# Patient Record
Sex: Female | Born: 1965 | Race: White | Hispanic: No | Marital: Single | Smoking: Current some day smoker
Health system: Southern US, Community
[De-identification: ages and names within clinical notes are randomized; demographics above are authoritative.]

## PROBLEM LIST (undated history)

## (undated) DIAGNOSIS — R51 Headache: Secondary | ICD-10-CM

## (undated) DIAGNOSIS — F329 Major depressive disorder, single episode, unspecified: Secondary | ICD-10-CM

## (undated) DIAGNOSIS — IMO0002 Reserved for concepts with insufficient information to code with codable children: Secondary | ICD-10-CM

## (undated) DIAGNOSIS — C55 Malignant neoplasm of uterus, part unspecified: Secondary | ICD-10-CM

## (undated) DIAGNOSIS — F1910 Other psychoactive substance abuse, uncomplicated: Secondary | ICD-10-CM

## (undated) DIAGNOSIS — F32A Depression, unspecified: Secondary | ICD-10-CM

## (undated) HISTORY — PX: BREAST LUMPECTOMY: SHX2

## (undated) HISTORY — PX: ABDOMINAL HYSTERECTOMY: SHX81

## (undated) HISTORY — PX: PARTIAL HYSTERECTOMY: SHX80

## (undated) HISTORY — PX: TUBAL LIGATION: SHX77

## (undated) HISTORY — PX: TONSILLECTOMY: SUR1361

---

## 2002-04-11 ENCOUNTER — Emergency Department (HOSPITAL_COMMUNITY): Admission: EM | Admit: 2002-04-11 | Discharge: 2002-04-11 | Payer: Self-pay

## 2002-10-01 ENCOUNTER — Emergency Department (HOSPITAL_COMMUNITY): Admission: EM | Admit: 2002-10-01 | Discharge: 2002-10-02 | Payer: Self-pay | Admitting: Emergency Medicine

## 2003-01-21 ENCOUNTER — Emergency Department (HOSPITAL_COMMUNITY): Admission: EM | Admit: 2003-01-21 | Discharge: 2003-01-21 | Payer: Self-pay | Admitting: Emergency Medicine

## 2003-05-28 ENCOUNTER — Emergency Department (HOSPITAL_COMMUNITY): Admission: EM | Admit: 2003-05-28 | Discharge: 2003-05-28 | Payer: Self-pay | Admitting: Emergency Medicine

## 2009-01-16 ENCOUNTER — Emergency Department (HOSPITAL_COMMUNITY): Admission: EM | Admit: 2009-01-16 | Discharge: 2009-01-16 | Payer: Self-pay | Admitting: Emergency Medicine

## 2009-04-19 ENCOUNTER — Emergency Department (HOSPITAL_COMMUNITY): Admission: EM | Admit: 2009-04-19 | Discharge: 2009-04-19 | Payer: Self-pay | Admitting: Emergency Medicine

## 2009-10-12 ENCOUNTER — Emergency Department (HOSPITAL_COMMUNITY): Admission: EM | Admit: 2009-10-12 | Discharge: 2009-10-12 | Payer: Self-pay | Admitting: Emergency Medicine

## 2009-12-28 ENCOUNTER — Emergency Department (HOSPITAL_COMMUNITY): Admission: EM | Admit: 2009-12-28 | Discharge: 2009-12-28 | Payer: Self-pay | Admitting: Emergency Medicine

## 2010-04-06 ENCOUNTER — Emergency Department (HOSPITAL_COMMUNITY): Admission: EM | Admit: 2010-04-06 | Discharge: 2010-04-07 | Payer: Self-pay | Admitting: Emergency Medicine

## 2010-08-28 ENCOUNTER — Emergency Department (HOSPITAL_COMMUNITY): Admission: EM | Admit: 2010-08-28 | Discharge: 2010-08-28 | Payer: Self-pay | Admitting: Internal Medicine

## 2010-10-14 ENCOUNTER — Emergency Department (HOSPITAL_COMMUNITY): Admission: EM | Admit: 2010-10-14 | Discharge: 2010-10-14 | Payer: Self-pay | Admitting: Emergency Medicine

## 2010-11-03 ENCOUNTER — Emergency Department (HOSPITAL_COMMUNITY): Admission: EM | Admit: 2010-11-03 | Discharge: 2010-11-03 | Payer: Self-pay | Admitting: Internal Medicine

## 2010-11-18 ENCOUNTER — Emergency Department (HOSPITAL_COMMUNITY): Admission: EM | Admit: 2010-11-18 | Discharge: 2010-10-07 | Payer: Self-pay | Admitting: Emergency Medicine

## 2010-11-22 ENCOUNTER — Emergency Department (HOSPITAL_COMMUNITY)
Admission: EM | Admit: 2010-11-22 | Discharge: 2010-11-22 | Payer: Self-pay | Source: Home / Self Care | Admitting: Emergency Medicine

## 2010-12-07 ENCOUNTER — Emergency Department (HOSPITAL_BASED_OUTPATIENT_CLINIC_OR_DEPARTMENT_OTHER)
Admission: EM | Admit: 2010-12-07 | Discharge: 2010-12-07 | Payer: Self-pay | Source: Home / Self Care | Admitting: Emergency Medicine

## 2010-12-25 ENCOUNTER — Emergency Department (HOSPITAL_COMMUNITY)
Admission: EM | Admit: 2010-12-25 | Discharge: 2010-12-25 | Payer: Self-pay | Source: Home / Self Care | Admitting: Emergency Medicine

## 2011-01-13 ENCOUNTER — Emergency Department (HOSPITAL_COMMUNITY)
Admission: EM | Admit: 2011-01-13 | Discharge: 2011-01-13 | Disposition: A | Discharge status: Home / Self Care | Payer: Self-pay | Attending: Emergency Medicine | Admitting: Emergency Medicine

## 2011-01-13 ENCOUNTER — Emergency Department (HOSPITAL_COMMUNITY): Payer: Self-pay

## 2011-01-13 DIAGNOSIS — X500XXA Overexertion from strenuous movement or load, initial encounter: Secondary | ICD-10-CM | POA: Insufficient documentation

## 2011-01-13 DIAGNOSIS — S93409A Sprain of unspecified ligament of unspecified ankle, initial encounter: Secondary | ICD-10-CM | POA: Insufficient documentation

## 2011-01-13 DIAGNOSIS — G8929 Other chronic pain: Secondary | ICD-10-CM | POA: Insufficient documentation

## 2011-01-13 DIAGNOSIS — F17200 Nicotine dependence, unspecified, uncomplicated: Secondary | ICD-10-CM | POA: Insufficient documentation

## 2011-01-13 DIAGNOSIS — Y92009 Unspecified place in unspecified non-institutional (private) residence as the place of occurrence of the external cause: Secondary | ICD-10-CM | POA: Insufficient documentation

## 2011-02-22 LAB — COMPREHENSIVE METABOLIC PANEL
ALT: 18 U/L (ref 0–35)
AST: 23 U/L (ref 0–37)
Alkaline Phosphatase: 55 U/L (ref 39–117)
CO2: 28 mEq/L (ref 19–32)
Chloride: 103 mEq/L (ref 96–112)
GFR calc Af Amer: >60 mL/min (ref >60)
GFR calc non Af Amer: >60 mL/min (ref >60)
Glucose, Bld: 88 mg/dL (ref 70–99)
Potassium: 4.4 mEq/L (ref 3.5–5.1)
Sodium: 139 mEq/L (ref 135–145)
Total Bilirubin: 1 mg/dL (ref 0.3–1.2)

## 2011-02-22 LAB — URINALYSIS, ROUTINE W REFLEX MICROSCOPIC
Glucose, UA: NEGATIVE mg/dL
Ketones, ur: NEGATIVE mg/dL
Protein, ur: NEGATIVE mg/dL
Urobilinogen, UA: 0.2 mg/dL (ref 0.0–1.0)

## 2011-02-22 LAB — DIFFERENTIAL
Basophils Absolute: 0.1 10*3/uL (ref 0.0–0.1)
Basophils Relative: 1 % (ref 0–1)
Eosinophils Absolute: 0.2 10*3/uL (ref 0.0–0.7)
Eosinophils Relative: 2 % (ref 0–5)
Lymphs Abs: 2.1 10*3/uL (ref 0.7–4.0)
Neutrophils Relative %: 63 % (ref 43–77)

## 2011-02-22 LAB — CBC
HCT: 38 % (ref 36.0–46.0)
Hemoglobin: 13.1 g/dL (ref 12.0–15.0)
RBC: 4.28 MIL/uL (ref 3.87–5.11)

## 2011-02-22 LAB — URINE MICROSCOPIC-ADD ON

## 2011-02-23 LAB — URINALYSIS, ROUTINE W REFLEX MICROSCOPIC
Glucose, UA: NEGATIVE mg/dL
Ketones, ur: NEGATIVE mg/dL
Protein, ur: NEGATIVE mg/dL

## 2011-02-23 LAB — URINE CULTURE
Colony Count: NO GROWTH
Culture  Setup Time: 201110270838
Culture: NO GROWTH

## 2011-02-23 LAB — BASIC METABOLIC PANEL
Chloride: 108 mEq/L (ref 96–112)
GFR calc Af Amer: >60 mL/min (ref >60)
Potassium: 3.4 mEq/L — ABNORMAL LOW (ref 3.5–5.1)

## 2011-02-23 LAB — URINE MICROSCOPIC-ADD ON

## 2011-03-01 LAB — CBC
MCV: 94.1 fL (ref 78.0–100.0)
Platelets: 293 10*3/uL (ref 150–400)
WBC: 9.9 10*3/uL (ref 4.0–10.5)

## 2011-03-01 LAB — RAPID URINE DRUG SCREEN, HOSP PERFORMED: Benzodiazepines: NOT DETECTED

## 2011-03-01 LAB — URINALYSIS, ROUTINE W REFLEX MICROSCOPIC
Glucose, UA: NEGATIVE mg/dL
Specific Gravity, Urine: 1.001 — ABNORMAL LOW (ref 1.005–1.030)
pH: 7 (ref 5.0–8.0)

## 2011-03-01 LAB — BRAIN NATRIURETIC PEPTIDE: Pro B Natriuretic peptide (BNP): <30 pg/mL (ref 0.0–100.0)

## 2011-03-01 LAB — URINE MICROSCOPIC-ADD ON

## 2011-03-01 LAB — ETHANOL: Alcohol, Ethyl (B): 35 mg/dL — ABNORMAL HIGH (ref 0–10)

## 2011-03-01 LAB — POCT PREGNANCY, URINE: Preg Test, Ur: NEGATIVE

## 2011-03-01 LAB — SALICYLATE LEVEL: Salicylate Lvl: <4 mg/dL (ref 2.8–20.0)

## 2011-03-06 ENCOUNTER — Emergency Department (HOSPITAL_COMMUNITY)
Admission: EM | Admit: 2011-03-06 | Discharge: 2011-03-06 | Disposition: A | Discharge status: Home / Self Care | Payer: Self-pay | Attending: Emergency Medicine | Admitting: Emergency Medicine

## 2011-03-06 DIAGNOSIS — M545 Low back pain, unspecified: Secondary | ICD-10-CM | POA: Insufficient documentation

## 2011-03-06 DIAGNOSIS — X500XXA Overexertion from strenuous movement or load, initial encounter: Secondary | ICD-10-CM | POA: Insufficient documentation

## 2011-03-06 DIAGNOSIS — M62838 Other muscle spasm: Secondary | ICD-10-CM | POA: Insufficient documentation

## 2011-03-06 DIAGNOSIS — S335XXA Sprain of ligaments of lumbar spine, initial encounter: Secondary | ICD-10-CM | POA: Insufficient documentation

## 2011-03-29 LAB — URINALYSIS, ROUTINE W REFLEX MICROSCOPIC
Bilirubin Urine: NEGATIVE
Nitrite: NEGATIVE
Specific Gravity, Urine: 1.001 — ABNORMAL LOW (ref 1.005–1.030)
Urobilinogen, UA: 0.2 mg/dL (ref 0.0–1.0)

## 2011-03-29 LAB — URINE CULTURE

## 2011-06-20 ENCOUNTER — Emergency Department (HOSPITAL_COMMUNITY)
Admission: EM | Admit: 2011-06-20 | Discharge: 2011-06-20 | Disposition: A | Discharge status: Home / Self Care | Payer: Self-pay | Attending: Emergency Medicine | Admitting: Emergency Medicine

## 2011-06-20 DIAGNOSIS — Z87442 Personal history of urinary calculi: Secondary | ICD-10-CM | POA: Insufficient documentation

## 2011-06-20 DIAGNOSIS — R109 Unspecified abdominal pain: Secondary | ICD-10-CM | POA: Insufficient documentation

## 2011-06-20 DIAGNOSIS — R300 Dysuria: Secondary | ICD-10-CM | POA: Insufficient documentation

## 2011-06-20 DIAGNOSIS — R112 Nausea with vomiting, unspecified: Secondary | ICD-10-CM | POA: Insufficient documentation

## 2011-06-20 DIAGNOSIS — N390 Urinary tract infection, site not specified: Secondary | ICD-10-CM | POA: Insufficient documentation

## 2011-06-20 LAB — URINE MICROSCOPIC-ADD ON

## 2011-06-20 LAB — URINALYSIS, ROUTINE W REFLEX MICROSCOPIC
Glucose, UA: NEGATIVE mg/dL
Specific Gravity, Urine: 1.023 (ref 1.005–1.030)
pH: 7 (ref 5.0–8.0)

## 2011-06-22 ENCOUNTER — Emergency Department (HOSPITAL_COMMUNITY)
Admission: EM | Admit: 2011-06-22 | Discharge: 2011-06-22 | Disposition: A | Discharge status: Home / Self Care | Payer: Self-pay | Attending: Emergency Medicine | Admitting: Emergency Medicine

## 2011-06-22 DIAGNOSIS — R109 Unspecified abdominal pain: Secondary | ICD-10-CM | POA: Insufficient documentation

## 2011-06-22 DIAGNOSIS — IMO0002 Reserved for concepts with insufficient information to code with codable children: Secondary | ICD-10-CM | POA: Insufficient documentation

## 2011-06-22 DIAGNOSIS — M545 Low back pain, unspecified: Secondary | ICD-10-CM | POA: Insufficient documentation

## 2011-06-22 DIAGNOSIS — R112 Nausea with vomiting, unspecified: Secondary | ICD-10-CM | POA: Insufficient documentation

## 2011-06-22 DIAGNOSIS — N390 Urinary tract infection, site not specified: Secondary | ICD-10-CM | POA: Insufficient documentation

## 2011-06-22 DIAGNOSIS — Z87442 Personal history of urinary calculi: Secondary | ICD-10-CM | POA: Insufficient documentation

## 2011-06-22 DIAGNOSIS — R300 Dysuria: Secondary | ICD-10-CM | POA: Insufficient documentation

## 2011-06-22 LAB — URINALYSIS, ROUTINE W REFLEX MICROSCOPIC
Ketones, ur: NEGATIVE mg/dL
Leukocytes, UA: NEGATIVE
Nitrite: NEGATIVE
Protein, ur: NEGATIVE mg/dL
Urobilinogen, UA: 0.2 mg/dL (ref 0.0–1.0)

## 2011-06-22 LAB — RAPID URINE DRUG SCREEN, HOSP PERFORMED
Amphetamines: NOT DETECTED
Barbiturates: NOT DETECTED
Tetrahydrocannabinol: NOT DETECTED

## 2011-06-23 LAB — URINE CULTURE

## 2011-08-15 ENCOUNTER — Emergency Department (HOSPITAL_COMMUNITY): Payer: Self-pay

## 2011-08-15 ENCOUNTER — Emergency Department (HOSPITAL_COMMUNITY)
Admission: EM | Admit: 2011-08-15 | Discharge: 2011-08-16 | Disposition: A | Discharge status: Home / Self Care | Payer: Self-pay | Attending: Emergency Medicine | Admitting: Emergency Medicine

## 2011-08-15 DIAGNOSIS — Z87442 Personal history of urinary calculi: Secondary | ICD-10-CM | POA: Insufficient documentation

## 2011-08-15 DIAGNOSIS — M79609 Pain in unspecified limb: Secondary | ICD-10-CM | POA: Insufficient documentation

## 2011-08-15 DIAGNOSIS — S5010XA Contusion of unspecified forearm, initial encounter: Secondary | ICD-10-CM | POA: Insufficient documentation

## 2011-08-15 DIAGNOSIS — IMO0002 Reserved for concepts with insufficient information to code with codable children: Secondary | ICD-10-CM | POA: Insufficient documentation

## 2011-08-15 DIAGNOSIS — M25539 Pain in unspecified wrist: Secondary | ICD-10-CM | POA: Insufficient documentation

## 2011-09-14 ENCOUNTER — Emergency Department (HOSPITAL_COMMUNITY)
Admission: EM | Admit: 2011-09-14 | Discharge: 2011-09-14 | Disposition: A | Discharge status: Home / Self Care | Payer: Self-pay | Attending: Emergency Medicine | Admitting: Emergency Medicine

## 2011-09-14 DIAGNOSIS — R05 Cough: Secondary | ICD-10-CM | POA: Insufficient documentation

## 2011-09-14 DIAGNOSIS — R059 Cough, unspecified: Secondary | ICD-10-CM | POA: Insufficient documentation

## 2011-09-14 DIAGNOSIS — F17200 Nicotine dependence, unspecified, uncomplicated: Secondary | ICD-10-CM | POA: Insufficient documentation

## 2011-09-14 DIAGNOSIS — Z8542 Personal history of malignant neoplasm of other parts of uterus: Secondary | ICD-10-CM | POA: Insufficient documentation

## 2011-09-14 DIAGNOSIS — M549 Dorsalgia, unspecified: Secondary | ICD-10-CM | POA: Insufficient documentation

## 2011-12-15 ENCOUNTER — Emergency Department (HOSPITAL_COMMUNITY)
Admission: EM | Admit: 2011-12-15 | Discharge: 2011-12-15 | Disposition: A | Discharge status: Home / Self Care | Payer: Self-pay | Attending: Emergency Medicine | Admitting: Emergency Medicine

## 2011-12-15 DIAGNOSIS — M549 Dorsalgia, unspecified: Secondary | ICD-10-CM

## 2011-12-15 DIAGNOSIS — M545 Low back pain, unspecified: Secondary | ICD-10-CM | POA: Insufficient documentation

## 2011-12-15 MED ORDER — CYCLOBENZAPRINE HCL 10 MG PO TABS: 10.0000 mg | ORAL_TABLET | Freq: Three times a day (TID) | ORAL | Status: AC | PRN

## 2011-12-15 MED ORDER — OXYCODONE-ACETAMINOPHEN 5-325 MG PO TABS: 1.0000 | ORAL_TABLET | ORAL | Status: AC | PRN

## 2011-12-15 MED ORDER — IBUPROFEN 600 MG PO TABS: 600.0000 mg | ORAL_TABLET | Freq: Four times a day (QID) | ORAL | Status: AC | PRN

## 2011-12-15 NOTE — ED Notes (Signed)
Patient reports that she developed lower back pain after moving dresser, hx of same. Ambulatory on arrival

## 2011-12-15 NOTE — ED Provider Notes (Signed)
History     CSN: [id number]  Arrival date & time 12/15/11  1109   First MD Initiated Contact with Patient 12/15/11 1146      Chief Complaint  Patient presents with  . Back Pain    recently moved and has been lifting heavy boxes    (Consider location/radiation/quality/duration/timing/severity/associated sxs/prior treatment) Patient is a 46 y.o. female presenting with back pain. The history is provided by the patient. No language interpreter was used.  Back Pain  This is a recurrent problem. The current episode started 3 to 5 hours ago. The problem occurs constantly. The problem has been gradually worsening. The pain is associated with lifting heavy objects. The quality of the pain is described as aching. The pain does not radiate. The pain is at a severity of 7/10. The pain is moderate. The symptoms are aggravated by bending and certain positions. Pertinent negatives include no fever, no abdominal swelling, no bowel incontinence, no perianal numbness, no bladder incontinence, no dysuria, no pelvic pain, no leg pain, no paresthesias, no paresis, no tingling and no weakness. She has tried NSAIDs for the symptoms. The treatment provided no relief.    No past medical history on file.  No past surgical history on file.  No family history on file.  History  Substance Use Topics  . Smoking status: Not on file  . Smokeless tobacco: Not on file  . Alcohol Use: Not on file    OB History    No data available      Review of Systems  Constitutional: Negative for fever.  Gastrointestinal: Negative for bowel incontinence.  Genitourinary: Negative for bladder incontinence, dysuria and pelvic pain.  Musculoskeletal: Positive for back pain.  Neurological: Negative for tingling, weakness and paresthesias.    Allergies  Review of patient's allergies indicates no known allergies.  Home Medications   Current Outpatient Rx  Name Route Sig Dispense Refill  . ACETAMINOPHEN 500 MG PO TABS  Oral Take 500 mg by mouth every 6 (six) hours as needed. pain     . IBUPROFEN 200 MG PO TABS Oral Take 200 mg by mouth every 6 (six) hours as needed. pain     . NAPROXEN SODIUM 220 MG PO TABS Oral Take 220 mg by mouth 2 (two) times daily with a meal.        BP 134/90  Pulse 94  Temp 98.4 F (36.9 C)  Resp 18  SpO2 100%  Physical Exam  Nursing note and vitals reviewed. Constitutional: She is oriented to person, place, and time. She appears well-developed and well-nourished. No distress.  Eyes: Pupils are equal, round, and reactive to light.  Neck: Normal range of motion. Neck supple.  Cardiovascular: Normal rate.   Pulmonary/Chest: Effort normal and breath sounds normal.  Musculoskeletal: She exhibits tenderness. She exhibits no edema.       L lower back pain after lifting today  Neurological: She is alert and oriented to person, place, and time.  Skin: Skin is warm and dry. No erythema.  Psychiatric: She has a normal mood and affect.    ED Course  Procedures (including critical care time)  Labs Reviewed - No data to display No results found.   No diagnosis found.    MDM  Chronic Lower back pain worse after lifting boxes today.  Will treat with ibuprofen, ice flexoril and percocet.  Will follow up with PCP if not better tomorrow.  NO fever, good reflexes, no bowel or bladder problems.  Medical screening examination/treatment/procedure(s) were performed by non-physician practitioner and as supervising physician I was immediately available for consultation/collaboration. Osvaldo Human, M.D.     Jethro Bastos, NP 12/15/11 1823  Carleene Cooper III, MD 12/18/11 [phone]

## 2012-01-07 ENCOUNTER — Emergency Department (HOSPITAL_COMMUNITY)
Admission: EM | Admit: 2012-01-07 | Discharge: 2012-01-09 | Disposition: A | Discharge status: Other Acute Inpatient Hospital | Payer: Self-pay | Attending: Emergency Medicine | Admitting: Emergency Medicine

## 2012-01-07 ENCOUNTER — Encounter (HOSPITAL_COMMUNITY): Payer: Self-pay | Admitting: *Deleted

## 2012-01-07 DIAGNOSIS — R109 Unspecified abdominal pain: Secondary | ICD-10-CM | POA: Insufficient documentation

## 2012-01-07 DIAGNOSIS — F1910 Other psychoactive substance abuse, uncomplicated: Secondary | ICD-10-CM | POA: Insufficient documentation

## 2012-01-07 DIAGNOSIS — S41109A Unspecified open wound of unspecified upper arm, initial encounter: Secondary | ICD-10-CM | POA: Insufficient documentation

## 2012-01-07 DIAGNOSIS — S41111A Laceration without foreign body of right upper arm, initial encounter: Secondary | ICD-10-CM

## 2012-01-07 DIAGNOSIS — X789XXA Intentional self-harm by unspecified sharp object, initial encounter: Secondary | ICD-10-CM | POA: Insufficient documentation

## 2012-01-07 DIAGNOSIS — F1010 Alcohol abuse, uncomplicated: Secondary | ICD-10-CM | POA: Insufficient documentation

## 2012-01-07 DIAGNOSIS — R112 Nausea with vomiting, unspecified: Secondary | ICD-10-CM | POA: Insufficient documentation

## 2012-01-07 DIAGNOSIS — F10929 Alcohol use, unspecified with intoxication, unspecified: Secondary | ICD-10-CM

## 2012-01-07 DIAGNOSIS — R10819 Abdominal tenderness, unspecified site: Secondary | ICD-10-CM | POA: Insufficient documentation

## 2012-01-07 HISTORY — DX: Other psychoactive substance abuse, uncomplicated: F19.10

## 2012-01-07 HISTORY — DX: Malignant neoplasm of uterus, part unspecified: C55

## 2012-01-07 HISTORY — DX: Reserved for concepts with insufficient information to code with codable children: IMO0002

## 2012-01-07 LAB — DIFFERENTIAL
Eosinophils Absolute: 0.4 10*3/uL (ref 0.0–0.7)
Eosinophils Relative: 6 % — ABNORMAL HIGH (ref 0–5)
Lymphocytes Relative: 43 % (ref 12–46)
Lymphs Abs: 3 10*3/uL (ref 0.7–4.0)
Monocytes Relative: 6 % (ref 3–12)

## 2012-01-07 LAB — CBC
Hemoglobin: 14.7 g/dL (ref 12.0–15.0)
MCH: 32.2 pg (ref 26.0–34.0)
MCV: 90.8 fL (ref 78.0–100.0)
RBC: 4.56 MIL/uL (ref 3.87–5.11)
WBC: 7 10*3/uL (ref 4.0–10.5)

## 2012-01-07 MED ORDER — METOCLOPRAMIDE HCL 5 MG/ML IJ SOLN
10.0000 mg | Freq: Once | INTRAMUSCULAR | Status: AC
  Administered 2012-01-08: 10 mg via INTRAMUSCULAR
  Filled 2012-01-07: qty 2

## 2012-01-07 MED ORDER — LORAZEPAM 1 MG PO TABS
1.0000 mg | ORAL_TABLET | Freq: Once | ORAL | Status: AC
  Administered 2012-01-07: 1 mg via ORAL
  Filled 2012-01-07: qty 1

## 2012-01-07 MED ORDER — ONDANSETRON 8 MG PO TBDP
8.0000 mg | ORAL_TABLET | Freq: Once | ORAL | Status: AC
  Administered 2012-01-07: 8 mg via ORAL
  Filled 2012-01-07: qty 1

## 2012-01-07 NOTE — ED Provider Notes (Signed)
History     CSN: [id number]  Arrival date & time 01/07/12  2127   First MD Initiated Contact with Patient 01/07/12 2210      Chief Complaint  Patient presents with  . Medical Clearance     HPI  History provided by the patient. Patient is a 46 year old female with past history of substance abuse who presents with complaints of opiate withdrawal symptoms as well as lacerations to her right wrist. Patient states that she became upset with one of her friends who is withholding drugs for her and she acted out by cutting her wrists with a razor. She states that does not attempt for suicide. Patient states she does not want to herself or kill herself. Patient denies any homicidal ideations. Patient states that she is feeling nauseated with episodes of heaving and abdominal discomfort. Patient also reports having constipation for the last 2 days. She has no history of prior suicidal attempts. Patient states she has been in AA for drug problems in the past and does have a sponsor but has not contacted them. Patient requests help with her symptoms and drug addiction. Patient reports using oxycodone and Phenergan orally. She denies IV use.    Past Medical History  Diagnosis Date  . Substance abuse   . Uterine cancer   . Degenerative disk disease     Past Surgical History  Procedure Date  . Partial hysterectomy     No family history on file.  History  Substance Use Topics  . Smoking status: Not on file  . Smokeless tobacco: Not on file  . Alcohol Use:     OB History    Grav Para Term Preterm Abortions TAB SAB Ect Mult Living                  Review of Systems  Constitutional: Negative for fever.  Gastrointestinal: Positive for nausea, vomiting, abdominal pain and constipation.  All other systems reviewed and are negative.    Allergies  Review of patient's allergies indicates no known allergies.  Home Medications   Current Outpatient Rx  Name Route Sig Dispense Refill    . ACETAMINOPHEN 500 MG PO TABS Oral Take 1,000 mg by mouth every 6 (six) hours as needed. pain    . FENTANYL 100 MCG/HR TD PT72 Oral Take 0.25 patches by mouth daily. Patient cuts up patches and chews a quarter of a patch daily.    . OXYCODONE HCL 30 MG PO TABS Oral Take 30 mg by mouth 3 (three) times daily.      BP 134/87  Pulse 107  Temp(Src) 98.2 F (36.8 C) (Oral)  Resp 18  SpO2 100%  Physical Exam  Nursing note and vitals reviewed. Constitutional: She is oriented to person, place, and time. She appears well-developed and well-nourished. No distress.  HENT:  Head: Normocephalic and atraumatic.  Mouth/Throat: Oropharynx is clear and moist.  Eyes: Conjunctivae and EOM are normal. Pupils are equal, round, and reactive to light.  Cardiovascular: Normal rate and regular rhythm.   Pulmonary/Chest: Effort normal and breath sounds normal. No respiratory distress. She has no wheezes. She has no rales.  Abdominal: Soft. Bowel sounds are normal. She exhibits no distension. There is tenderness. There is no rebound and no guarding.       Mild diffuse tenderness  Musculoskeletal: Normal range of motion. She exhibits no edema and no tenderness.  Neurological: She is alert and oriented to person, place, and time.  Skin: Skin is warm and  dry. No rash noted. She is not diaphoretic.       Multiple superficial linear lacerations over right forearm and wrist. No tendon or deeper structure involvement. Normal distal movements in hand with normal sensation and cap refill.  Psychiatric: She has a normal mood and affect. Her behavior is normal. She does not exhibit a depressed mood. She expresses no homicidal and no suicidal ideation. She expresses no suicidal plans.    ED Course  Procedures    Labs Reviewed  CBC  DIFFERENTIAL  COMPREHENSIVE METABOLIC PANEL  ACETAMINOPHEN LEVEL  URINE RAPID DRUG SCREEN (HOSP PERFORMED)  ETHANOL   Results for orders placed during the hospital encounter of  01/07/12  CBC      Component Value Range   WBC 7.0  4.0 - 10.5 (K/uL)   RBC 4.56  3.87 - 5.11 (MIL/uL)   Hemoglobin 14.7  12.0 - 15.0 (g/dL)   HCT 96.0  45.4 - 09.8 (%)   MCV 90.8  78.0 - 100.0 (fL)   MCH 32.2  26.0 - 34.0 (pg)   MCHC 35.5  30.0 - 36.0 (g/dL)   RDW 11.9  14.7 - 82.9 (%)   Platelets 308  150 - 400 (K/uL)  DIFFERENTIAL      Component Value Range   Neutrophils Relative 43  43 - 77 (%)   Neutro Abs 3.0  1.7 - 7.7 (K/uL)   Lymphocytes Relative 43  12 - 46 (%)   Lymphs Abs 3.0  0.7 - 4.0 (K/uL)   Monocytes Relative 6  3 - 12 (%)   Monocytes Absolute 0.4  0.1 - 1.0 (K/uL)   Eosinophils Relative 6 (*) 0 - 5 (%)   Eosinophils Absolute 0.4  0.0 - 0.7 (K/uL)   Basophils Relative 1  0 - 1 (%)   Basophils Absolute 0.1  0.0 - 0.1 (K/uL)  COMPREHENSIVE METABOLIC PANEL      Component Value Range   Sodium 142  135 - 145 (mEq/L)   Potassium 3.2 (*) 3.5 - 5.1 (mEq/L)   Chloride 105  96 - 112 (mEq/L)   CO2 25  19 - 32 (mEq/L)   Glucose, Bld 84  70 - 99 (mg/dL)   BUN 14  6 - 23 (mg/dL)   Creatinine, Ser 5.62  0.50 - 1.10 (mg/dL)   Calcium 8.9  8.4 - 13.0 (mg/dL)   Total Protein 7.8  6.0 - 8.3 (g/dL)   Albumin 4.1  3.5 - 5.2 (g/dL)   AST 25  0 - 37 (U/L)   ALT 10  0 - 35 (U/L)   Alkaline Phosphatase 59  39 - 117 (U/L)   Total Bilirubin 0.3  0.3 - 1.2 (mg/dL)   GFR calc non Af Amer >90  >90 (mL/min)   GFR calc Af Amer >90  >90 (mL/min)  ACETAMINOPHEN LEVEL      Component Value Range   Acetaminophen (Tylenol), Serum <15.0  10 - 30 (ug/mL)  URINE RAPID DRUG SCREEN (HOSP PERFORMED)      Component Value Range   Opiates NONE DETECTED  NONE DETECTED    Cocaine NONE DETECTED  NONE DETECTED    Benzodiazepines POSITIVE (*) NONE DETECTED    Amphetamines NONE DETECTED  NONE DETECTED    Tetrahydrocannabinol POSITIVE (*) NONE DETECTED    Barbiturates NONE DETECTED  NONE DETECTED   ETHANOL      Component Value Range   Alcohol, Ethyl (B) 204 (*) 0 - 11 (mg/dL)       1.  Alcohol  intoxication   2. Polysubstance abuse   3. Lacerations of multiple sites of right arm       MDM  10:00 PM patient seen and evaluated. Patient in no acute distress.  Patient has been resting comfortably over the night and sleeping.  I've spoken with [facility] act team. They will see patient and evaluate.  [facility] act team went to see patient but she did not wake up from sleep and they will reassess later.  Patient has continued to sleep through the night. Psych holding orders are in place. Telepsych consult ordered.      Angus Seller, [place] 01/08/12 [phone]
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
## 2012-01-07 NOTE — ED Notes (Signed)
ZOX:[id number]<AV> Expected date:01/07/12<BR> Expected time: 9:32 PM<BR> Means of arrival:Ambulance<BR> Comments:<BR> EMS [facility] - detox

## 2012-01-08 ENCOUNTER — Encounter (HOSPITAL_COMMUNITY): Payer: Self-pay | Admitting: *Deleted

## 2012-01-08 LAB — URINE MICROSCOPIC-ADD ON

## 2012-01-08 LAB — URINALYSIS, ROUTINE W REFLEX MICROSCOPIC
Ketones, ur: NEGATIVE mg/dL
Nitrite: NEGATIVE
Specific Gravity, Urine: 1.011 (ref 1.005–1.030)
Urobilinogen, UA: 0.2 mg/dL (ref 0.0–1.0)
pH: 7 (ref 5.0–8.0)

## 2012-01-08 LAB — ETHANOL: Alcohol, Ethyl (B): 204 mg/dL — ABNORMAL HIGH (ref 0–11)

## 2012-01-08 LAB — ACETAMINOPHEN LEVEL: Acetaminophen (Tylenol), Serum: <15 ug/mL (ref 10–30)

## 2012-01-08 LAB — HEPATIC FUNCTION PANEL
ALT: 8 U/L (ref 0–35)
AST: 23 U/L (ref 0–37)
Albumin: 3.7 g/dL (ref 3.5–5.2)
Total Bilirubin: 1.1 mg/dL (ref 0.3–1.2)

## 2012-01-08 LAB — COMPREHENSIVE METABOLIC PANEL
ALT: 10 U/L (ref 0–35)
Alkaline Phosphatase: 59 U/L (ref 39–117)
BUN: 14 mg/dL (ref 6–23)
CO2: 25 mEq/L (ref 19–32)
Calcium: 8.9 mg/dL (ref 8.4–10.5)
GFR calc Af Amer: >90 mL/min (ref >90)
GFR calc non Af Amer: >90 mL/min (ref >90)
Glucose, Bld: 84 mg/dL (ref 70–99)
Potassium: 3.2 mEq/L — ABNORMAL LOW (ref 3.5–5.1)
Sodium: 142 mEq/L (ref 135–145)
Total Protein: 7.8 g/dL (ref 6.0–8.3)

## 2012-01-08 LAB — CBC
HCT: 38.9 % (ref 36.0–46.0)
Platelets: 262 10*3/uL (ref 150–400)
RBC: 4.21 MIL/uL (ref 3.87–5.11)
RDW: 13.1 % (ref 11.5–15.5)
WBC: 7.3 10*3/uL (ref 4.0–10.5)

## 2012-01-08 LAB — RAPID URINE DRUG SCREEN, HOSP PERFORMED
Barbiturates: NOT DETECTED
Benzodiazepines: POSITIVE — AB

## 2012-01-08 LAB — POCT I-STAT, CHEM 8
BUN: 13 mg/dL (ref 6–23)
Creatinine, Ser: 0.7 mg/dL (ref 0.50–1.10)
Glucose, Bld: 96 mg/dL (ref 70–99)
Potassium: 3.6 mEq/L (ref 3.5–5.1)
Sodium: 136 mEq/L (ref 135–145)

## 2012-01-08 MED ORDER — LORAZEPAM 1 MG PO TABS: 1.0000 mg | ORAL_TABLET | Freq: Four times a day (QID) | ORAL | Status: DC | PRN

## 2012-01-08 MED ORDER — BACITRACIN ZINC 500 UNIT/GM EX OINT
TOPICAL_OINTMENT | CUTANEOUS | Status: AC
  Filled 2012-01-08: qty 1.8

## 2012-01-08 MED ORDER — ZOLPIDEM TARTRATE 5 MG PO TABS: 5.0000 mg | ORAL_TABLET | Freq: Every evening | ORAL | Status: DC | PRN

## 2012-01-08 MED ORDER — CLONIDINE HCL 0.1 MG PO TABS
0.1000 mg | ORAL_TABLET | Freq: Four times a day (QID) | ORAL | Status: DC
  Administered 2012-01-08 – 2012-01-09 (×4): 0.1 mg via ORAL
  Filled 2012-01-08 (×4): qty 1

## 2012-01-08 MED ORDER — METHOCARBAMOL 500 MG PO TABS
500.0000 mg | ORAL_TABLET | Freq: Three times a day (TID) | ORAL | Status: DC | PRN
  Administered 2012-01-09: 500 mg via ORAL
  Filled 2012-01-08: qty 1

## 2012-01-08 MED ORDER — LOPERAMIDE HCL 2 MG PO CAPS: 2.0000 mg | ORAL_CAPSULE | ORAL | Status: DC | PRN

## 2012-01-08 MED ORDER — NICOTINE 21 MG/24HR TD PT24
21.0000 mg | MEDICATED_PATCH | Freq: Once | TRANSDERMAL | Status: DC
  Administered 2012-01-08: 21 mg via TRANSDERMAL
  Filled 2012-01-08: qty 1

## 2012-01-08 MED ORDER — FLUOXETINE HCL 20 MG PO CAPS
20.0000 mg | ORAL_CAPSULE | Freq: Every day | ORAL | Status: DC
  Administered 2012-01-08 – 2012-01-09 (×2): 20 mg via ORAL
  Filled 2012-01-08 (×3): qty 1

## 2012-01-08 MED ORDER — FAMOTIDINE 20 MG PO TABS
20.0000 mg | ORAL_TABLET | Freq: Once | ORAL | Status: AC
  Administered 2012-01-08: 20 mg via ORAL
  Filled 2012-01-08: qty 1

## 2012-01-08 MED ORDER — LORAZEPAM 1 MG PO TABS: 0.0000 mg | ORAL_TABLET | Freq: Two times a day (BID) | ORAL | Status: DC

## 2012-01-08 MED ORDER — IBUPROFEN 600 MG PO TABS: 600.0000 mg | ORAL_TABLET | Freq: Three times a day (TID) | ORAL | Status: DC | PRN

## 2012-01-08 MED ORDER — CLONIDINE HCL 0.1 MG PO TABS: 0.1000 mg | ORAL_TABLET | ORAL | Status: DC

## 2012-01-08 MED ORDER — LORAZEPAM 1 MG PO TABS
2.0000 mg | ORAL_TABLET | Freq: Once | ORAL | Status: AC
  Administered 2012-01-08: 2 mg via ORAL
  Filled 2012-01-08: qty 2

## 2012-01-08 MED ORDER — ONDANSETRON HCL 4 MG PO TABS
4.0000 mg | ORAL_TABLET | Freq: Three times a day (TID) | ORAL | Status: DC | PRN
  Administered 2012-01-08: 4 mg via ORAL
  Filled 2012-01-08: qty 1

## 2012-01-08 MED ORDER — VITAMIN B-1 100 MG PO TABS
100.0000 mg | ORAL_TABLET | Freq: Every day | ORAL | Status: DC
  Administered 2012-01-08 – 2012-01-09 (×2): 100 mg via ORAL
  Filled 2012-01-08 (×2): qty 1

## 2012-01-08 MED ORDER — LORAZEPAM 2 MG/ML IJ SOLN: 1.0000 mg | Freq: Four times a day (QID) | INTRAMUSCULAR | Status: DC | PRN

## 2012-01-08 MED ORDER — LORAZEPAM 1 MG PO TABS
1.0000 mg | ORAL_TABLET | Freq: Three times a day (TID) | ORAL | Status: DC | PRN
  Administered 2012-01-08: 1 mg via ORAL
  Filled 2012-01-08: qty 1

## 2012-01-08 MED ORDER — HYDROXYZINE HCL 25 MG PO TABS: 25.0000 mg | ORAL_TABLET | Freq: Four times a day (QID) | ORAL | Status: DC | PRN

## 2012-01-08 MED ORDER — FAMOTIDINE 20 MG PO TABS
20.0000 mg | ORAL_TABLET | Freq: Once | ORAL | Status: DC
  Filled 2012-01-08: qty 1

## 2012-01-08 MED ORDER — TRAZODONE HCL 100 MG PO TABS: 100.0000 mg | ORAL_TABLET | Freq: Every evening | ORAL | Status: DC | PRN

## 2012-01-08 MED ORDER — LORAZEPAM 1 MG PO TABS
0.0000 mg | ORAL_TABLET | Freq: Four times a day (QID) | ORAL | Status: DC
  Administered 2012-01-08: 1 mg via ORAL
  Filled 2012-01-08: qty 1

## 2012-01-08 MED ORDER — ONDANSETRON 4 MG PO TBDP: 4.0000 mg | ORAL_TABLET | Freq: Four times a day (QID) | ORAL | Status: DC | PRN

## 2012-01-08 MED ORDER — FOLIC ACID 1 MG PO TABS
1.0000 mg | ORAL_TABLET | Freq: Every day | ORAL | Status: DC
  Administered 2012-01-08 – 2012-01-09 (×2): 1 mg via ORAL
  Filled 2012-01-08 (×2): qty 1

## 2012-01-08 MED ORDER — GI COCKTAIL ~~LOC~~
30.0000 mL | Freq: Once | ORAL | Status: AC
  Administered 2012-01-08: 30 mL via ORAL
  Filled 2012-01-08: qty 30

## 2012-01-08 MED ORDER — LORAZEPAM 1 MG PO TABS
2.0000 mg | ORAL_TABLET | Freq: Three times a day (TID) | ORAL | Status: DC
  Administered 2012-01-08 (×2): 1 mg via ORAL
  Administered 2012-01-09: 2 mg via ORAL
  Filled 2012-01-08 (×2): qty 2

## 2012-01-08 MED ORDER — DIPHENHYDRAMINE HCL 50 MG/ML IJ SOLN
50.0000 mg | Freq: Once | INTRAMUSCULAR | Status: DC
  Filled 2012-01-08: qty 1

## 2012-01-08 MED ORDER — ALUM & MAG HYDROXIDE-SIMETH 200-200-20 MG/5ML PO SUSP: 30.0000 mL | ORAL | Status: DC | PRN

## 2012-01-08 MED ORDER — ADULT MULTIVITAMIN W/MINERALS CH
1.0000 | ORAL_TABLET | Freq: Every day | ORAL | Status: DC
  Administered 2012-01-08 – 2012-01-09 (×2): 1 via ORAL
  Filled 2012-01-08 (×2): qty 1

## 2012-01-08 MED ORDER — CLONIDINE HCL 0.1 MG PO TABS: 0.1000 mg | ORAL_TABLET | Freq: Every day | ORAL | Status: DC

## 2012-01-08 MED ORDER — THIAMINE HCL 100 MG/ML IJ SOLN: 100.0000 mg | Freq: Every day | INTRAMUSCULAR | Status: DC

## 2012-01-08 MED ORDER — DICYCLOMINE HCL 20 MG PO TABS: 20.0000 mg | ORAL_TABLET | ORAL | Status: DC | PRN

## 2012-01-08 MED ORDER — GI COCKTAIL ~~LOC~~
30.0000 mL | Freq: Once | ORAL | Status: DC
  Filled 2012-01-08: qty 30

## 2012-01-08 MED ORDER — NAPROXEN 500 MG PO TABS
500.0000 mg | ORAL_TABLET | Freq: Two times a day (BID) | ORAL | Status: DC | PRN
  Administered 2012-01-09: 500 mg via ORAL
  Filled 2012-01-08: qty 1

## 2012-01-08 NOTE — [facility] Counselor (Signed)
Pt declined at [facility] by Adonis Brook and [facility] by Steward Drone due to suicidal behaviors and cuts on wrists.  These programs do not work with SI pts.  [facility] has pt information and is reviewing for placement.  Pt initially did not want to go to [facility] but is willing to go there now. Initially she wanted to go to [facility] but since that is not an option she is open to tx at [facility].

## 2012-01-08 NOTE — ED Notes (Signed)
eatting lunch tray w/o difficulty

## 2012-01-08 NOTE — ED Notes (Signed)
Up to the bathroom 

## 2012-01-08 NOTE — ED Notes (Signed)
ACT Team in to see pt.

## 2012-01-08 NOTE — ED Notes (Signed)
Pt c/o increased lower abd pain.  Pt reports pain is sharp/crampy.  Pt reports that she has this type of pain before when she was drinking, but has never had it checked out.  No vomiting since origional episode, pt has been able to tolerate soup and crackers.  No diarrhea reported since earlier.

## 2012-01-08 NOTE — ED Notes (Signed)
Pt reports she vomited blood, dr Patria Mane aware and will see

## 2012-01-08 NOTE — ED Notes (Signed)
abd pain primarily lower abd.  ABD soft, tender to palpate, pos bowel sounds x4 quads

## 2012-01-08 NOTE — ED Notes (Signed)
Pt had taken her dressing off of her wrist-no bleeding noted

## 2012-01-08 NOTE — ED Notes (Signed)
Chicken noodle soup/crackers given

## 2012-01-08 NOTE — ED Notes (Signed)
telepsych in progress 

## 2012-01-08 NOTE — ED Notes (Signed)
Dr hunt aware

## 2012-01-08 NOTE — ED Notes (Signed)
Calmer, sitting on the site of the bed eatting, pain has decreased now 5/10.  Pt  Reports that she has been having this pain "for a while.

## 2012-01-08 NOTE — Progress Notes (Signed)
This clinician attempted to assess but pt too drowsy. Per Lorita Officer, pt recently given Benadryl. This clinician will attempt to assess later this am.

## 2012-01-08 NOTE — ED Notes (Signed)
Up to the desk on the phone 

## 2012-01-08 NOTE — ED Notes (Signed)
[facility] contacted concerning consult results

## 2012-01-08 NOTE — ED Notes (Signed)
Pt up to the bathroom and reports that she had blood in her stool (not observer) and blood in her urine-pt denies s/s of UTI,  And still reports lower abd pain

## 2012-01-08 NOTE — ED Notes (Signed)
Rt wrist cleaned w/ NS, non-stick dressing applied

## 2012-01-08 NOTE — ED Notes (Signed)
Sleeping, resp even and unlabored

## 2012-01-08 NOTE — ED Notes (Signed)
Attempted to call report to psych ED. Nurse not ready for report at this time and states she will call back. Will reassess.

## 2012-01-08 NOTE — ED Notes (Signed)
Up tot he bathroom to collect urine specimen
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
## 2012-01-08 NOTE — ED Notes (Signed)
telepsych compete

## 2012-01-08 NOTE — ED Notes (Signed)
ACT team at bedside, pt too somnolent to comply, ACT team will return for eval.

## 2012-01-09 ENCOUNTER — Inpatient Hospital Stay (HOSPITAL_COMMUNITY)
Admission: AD | Admit: 2012-01-09 | Discharge: 2012-01-16 | DRG: 897 | Disposition: A | Discharge status: Home / Self Care | Payer: PRIVATE HEALTH INSURANCE | Source: Ambulatory Visit | Attending: Psychiatry | Admitting: Psychiatry

## 2012-01-09 ENCOUNTER — Encounter (HOSPITAL_COMMUNITY): Payer: Self-pay

## 2012-01-09 DIAGNOSIS — F4321 Adjustment disorder with depressed mood: Secondary | ICD-10-CM | POA: Diagnosis present

## 2012-01-09 DIAGNOSIS — F1210 Cannabis abuse, uncomplicated: Secondary | ICD-10-CM | POA: Diagnosis present

## 2012-01-09 DIAGNOSIS — F1910 Other psychoactive substance abuse, uncomplicated: Secondary | ICD-10-CM

## 2012-01-09 DIAGNOSIS — F1020 Alcohol dependence, uncomplicated: Secondary | ICD-10-CM | POA: Diagnosis present

## 2012-01-09 DIAGNOSIS — R319 Hematuria, unspecified: Secondary | ICD-10-CM | POA: Clinically undetermined

## 2012-01-09 DIAGNOSIS — F19939 Other psychoactive substance use, unspecified with withdrawal, unspecified: Secondary | ICD-10-CM | POA: Diagnosis present

## 2012-01-09 DIAGNOSIS — Z79899 Other long term (current) drug therapy: Secondary | ICD-10-CM

## 2012-01-09 DIAGNOSIS — Z8544 Personal history of malignant neoplasm of other female genital organs: Secondary | ICD-10-CM

## 2012-01-09 DIAGNOSIS — IMO0002 Reserved for concepts with insufficient information to code with codable children: Secondary | ICD-10-CM | POA: Diagnosis present

## 2012-01-09 DIAGNOSIS — B9689 Other specified bacterial agents as the cause of diseases classified elsewhere: Secondary | ICD-10-CM

## 2012-01-09 DIAGNOSIS — R109 Unspecified abdominal pain: Secondary | ICD-10-CM | POA: Diagnosis present

## 2012-01-09 DIAGNOSIS — F1120 Opioid dependence, uncomplicated: Secondary | ICD-10-CM | POA: Diagnosis present

## 2012-01-09 DIAGNOSIS — F4310 Post-traumatic stress disorder, unspecified: Secondary | ICD-10-CM | POA: Diagnosis present

## 2012-01-09 DIAGNOSIS — R112 Nausea with vomiting, unspecified: Secondary | ICD-10-CM

## 2012-01-09 LAB — CBC
Hemoglobin: 13 g/dL (ref 12.0–15.0)
MCH: 32.2 pg (ref 26.0–34.0)
MCV: 92.3 fL (ref 78.0–100.0)
RBC: 4.04 MIL/uL (ref 3.87–5.11)

## 2012-01-09 LAB — COMPREHENSIVE METABOLIC PANEL
ALT: 8 U/L (ref 0–35)
BUN: 14 mg/dL (ref 6–23)
CO2: 25 mEq/L (ref 19–32)
Calcium: 9.2 mg/dL (ref 8.4–10.5)
Creatinine, Ser: 0.7 mg/dL (ref 0.50–1.10)
GFR calc Af Amer: >90 mL/min (ref >90)
GFR calc non Af Amer: >90 mL/min (ref >90)
Glucose, Bld: 105 mg/dL — ABNORMAL HIGH (ref 70–99)

## 2012-01-09 LAB — LIPASE, BLOOD: Lipase: 52 U/L (ref 11–59)

## 2012-01-09 MED ORDER — CLONIDINE HCL 0.1 MG PO TABS
0.1000 mg | ORAL_TABLET | Freq: Four times a day (QID) | ORAL | Status: DC
  Administered 2012-01-09 – 2012-01-10 (×4): 0.1 mg via ORAL
  Filled 2012-01-09 (×4): qty 1

## 2012-01-09 MED ORDER — CHLORDIAZEPOXIDE HCL 25 MG PO CAPS
25.0000 mg | ORAL_CAPSULE | Freq: Four times a day (QID) | ORAL | Status: DC | PRN
  Administered 2012-01-11 – 2012-01-12 (×5): 25 mg via ORAL
  Filled 2012-01-09 (×6): qty 1

## 2012-01-09 MED ORDER — TRAZODONE HCL 50 MG PO TABS
50.0000 mg | ORAL_TABLET | Freq: Every evening | ORAL | Status: DC | PRN
  Administered 2012-01-09 – 2012-01-15 (×7): 50 mg via ORAL
  Filled 2012-01-09 (×5): qty 1
  Filled 2012-01-09: qty 7
  Filled 2012-01-09 (×2): qty 1

## 2012-01-09 MED ORDER — LOPERAMIDE HCL 2 MG PO CAPS
2.0000 mg | ORAL_CAPSULE | ORAL | Status: AC | PRN
  Administered 2012-01-10: 4 mg via ORAL

## 2012-01-09 MED ORDER — MAGNESIUM HYDROXIDE 400 MG/5ML PO SUSP
30.0000 mL | Freq: Every day | ORAL | Status: DC | PRN
  Administered 2012-01-15: 30 mL via ORAL

## 2012-01-09 MED ORDER — ALUM & MAG HYDROXIDE-SIMETH 200-200-20 MG/5ML PO SUSP
30.0000 mL | ORAL | Status: DC | PRN
  Administered 2012-01-10: 30 mL via ORAL

## 2012-01-09 MED ORDER — CLONIDINE HCL 0.1 MG PO TABS: 0.1000 mg | ORAL_TABLET | Freq: Every day | ORAL | Status: DC

## 2012-01-09 MED ORDER — METHOCARBAMOL 500 MG PO TABS
500.0000 mg | ORAL_TABLET | Freq: Three times a day (TID) | ORAL | Status: AC | PRN
  Administered 2012-01-10 – 2012-01-14 (×8): 500 mg via ORAL
  Filled 2012-01-09 (×8): qty 1

## 2012-01-09 MED ORDER — HYDROXYZINE HCL 25 MG PO TABS
25.0000 mg | ORAL_TABLET | Freq: Four times a day (QID) | ORAL | Status: DC | PRN
  Administered 2012-01-12: 25 mg via ORAL
  Filled 2012-01-09: qty 1

## 2012-01-09 MED ORDER — CLONIDINE HCL 0.1 MG PO TABS: 0.1000 mg | ORAL_TABLET | ORAL | Status: DC

## 2012-01-09 MED ORDER — NAPROXEN 500 MG PO TABS
500.0000 mg | ORAL_TABLET | Freq: Two times a day (BID) | ORAL | Status: AC | PRN
  Administered 2012-01-09 – 2012-01-13 (×4): 500 mg via ORAL
  Filled 2012-01-09 (×4): qty 1

## 2012-01-09 MED ORDER — DICYCLOMINE HCL 20 MG PO TABS
20.0000 mg | ORAL_TABLET | ORAL | Status: AC | PRN
  Administered 2012-01-09 – 2012-01-12 (×8): 20 mg via ORAL
  Filled 2012-01-09 (×8): qty 1

## 2012-01-09 MED ORDER — INFLUENZA VIRUS VACC SPLIT PF IM SUSP
0.5000 mL | INTRAMUSCULAR | Status: AC
  Administered 2012-01-10: 0.5 mL via INTRAMUSCULAR

## 2012-01-09 MED ORDER — ACETAMINOPHEN 325 MG PO TABS
650.0000 mg | ORAL_TABLET | Freq: Four times a day (QID) | ORAL | Status: DC | PRN
  Administered 2012-01-15 – 2012-01-16 (×2): 650 mg via ORAL

## 2012-01-09 MED ORDER — ONDANSETRON 4 MG PO TBDP
4.0000 mg | ORAL_TABLET | Freq: Four times a day (QID) | ORAL | Status: AC | PRN
  Administered 2012-01-10 – 2012-01-11 (×4): 4 mg via ORAL
  Filled 2012-01-09 (×2): qty 1

## 2012-01-09 NOTE — [facility] Counselor (Signed)
Accepted to [facility] by Lynann Bologna, NP to Dr. Lupe Carney. Patient is pending a 300 hall bed assignment at this time.
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
## 2012-01-09 NOTE — Progress Notes (Signed)
Voluntary admission for 46 y.o. Female with flat affect and anxious mood.  Pt. Reports drinking 1/2 gallon on vodka for the last 6 month and using heroin, non prescribed opiates  and chewing Fentanyl patches.  Reports her son died  in 75 and that she has never dealt with his death or anything else since then.  Pt. Has superficial laceration to her right wrist.  No signs or symptoms of infection noted.  Medical History of Uterine Cancer, DDD.  Pt. Tearful and reports that she has lost everything.  Pt.  Reported diarrhea today and urine pink in color.  Denies SI /HI and denies A/V hallucinations and contracts for safety.  Reports weight loss of 20 lbs.  Food and fluids given.

## 2012-01-09 NOTE — ED Notes (Signed)
Pt sleeping CIWA = 0 

## 2012-01-09 NOTE — ED Provider Notes (Signed)
Medical screening examination/treatment/procedure(s) were conducted as a shared visit with non-physician practitioner(s) and myself.  I personally evaluated the patient during the encounter  Loren Racer, MD 01/09/12 [phone]

## 2012-01-10 DIAGNOSIS — Z23 Encounter for immunization: Secondary | ICD-10-CM

## 2012-01-10 DIAGNOSIS — N760 Acute vaginitis: Secondary | ICD-10-CM

## 2012-01-10 DIAGNOSIS — A499 Bacterial infection, unspecified: Secondary | ICD-10-CM

## 2012-01-10 MED ORDER — CHLORDIAZEPOXIDE HCL 25 MG PO CAPS
25.0000 mg | ORAL_CAPSULE | Freq: Every day | ORAL | Status: AC
  Administered 2012-01-13: 25 mg via ORAL
  Filled 2012-01-10: qty 1

## 2012-01-10 MED ORDER — NICOTINE POLACRILEX 2 MG MT GUM
2.0000 mg | CHEWING_GUM | OROMUCOSAL | Status: DC | PRN
  Administered 2012-01-10: 2 mg via ORAL

## 2012-01-10 MED ORDER — CITALOPRAM HYDROBROMIDE 20 MG PO TABS
20.0000 mg | ORAL_TABLET | Freq: Every day | ORAL | Status: DC
  Administered 2012-01-10 – 2012-01-12 (×3): 20 mg via ORAL
  Filled 2012-01-10 (×4): qty 1

## 2012-01-10 MED ORDER — NICOTINE 21 MG/24HR TD PT24
21.0000 mg | MEDICATED_PATCH | Freq: Every day | TRANSDERMAL | Status: DC
  Administered 2012-01-10 – 2012-01-16 (×7): 21 mg via TRANSDERMAL
  Filled 2012-01-10 (×2): qty 1
  Filled 2012-01-10 (×2): qty 7
  Filled 2012-01-10 (×6): qty 1

## 2012-01-10 MED ORDER — THIAMINE HCL 100 MG/ML IJ SOLN
100.0000 mg | Freq: Once | INTRAMUSCULAR | Status: AC
  Administered 2012-01-10: 100 mg via INTRAMUSCULAR

## 2012-01-10 MED ORDER — CHLORDIAZEPOXIDE HCL 25 MG PO CAPS
25.0000 mg | ORAL_CAPSULE | Freq: Four times a day (QID) | ORAL | Status: AC
  Administered 2012-01-10 (×3): 25 mg via ORAL
  Filled 2012-01-10 (×3): qty 1

## 2012-01-10 MED ORDER — FAMOTIDINE 20 MG PO TABS
20.0000 mg | ORAL_TABLET | Freq: Every day | ORAL | Status: DC
  Administered 2012-01-10 – 2012-01-16 (×7): 20 mg via ORAL
  Filled 2012-01-10: qty 1
  Filled 2012-01-10: qty 7
  Filled 2012-01-10 (×7): qty 1

## 2012-01-10 MED ORDER — CHLORDIAZEPOXIDE HCL 25 MG PO CAPS
25.0000 mg | ORAL_CAPSULE | ORAL | Status: AC
  Administered 2012-01-12 (×2): 25 mg via ORAL
  Filled 2012-01-10: qty 1

## 2012-01-10 MED ORDER — CHLORDIAZEPOXIDE HCL 25 MG PO CAPS
25.0000 mg | ORAL_CAPSULE | Freq: Three times a day (TID) | ORAL | Status: AC
  Administered 2012-01-11 (×2): 25 mg via ORAL
  Filled 2012-01-10 (×3): qty 1

## 2012-01-10 MED ORDER — VITAMIN B-1 100 MG PO TABS
100.0000 mg | ORAL_TABLET | Freq: Every day | ORAL | Status: DC
  Administered 2012-01-11 – 2012-01-16 (×6): 100 mg via ORAL
  Filled 2012-01-10 (×8): qty 1

## 2012-01-10 MED ORDER — ADULT MULTIVITAMIN W/MINERALS CH
1.0000 | ORAL_TABLET | Freq: Every day | ORAL | Status: DC
  Administered 2012-01-10 – 2012-01-16 (×7): 1 via ORAL
  Filled 2012-01-10 (×7): qty 1

## 2012-01-10 NOTE — Tx Team (Signed)
Initial Interdisciplinary Treatment Plan  PATIENT STRENGTHS: (choose at least two) Average or above average intelligence Communication skills General fund of knowledge  PATIENT STRESSORS: Substance abuse   PROBLEM LIST: Problem List/Patient Goals Date to be addressed Date deferred Reason deferred Estimated date of resolution  Polysubstance abuse 01-09-12     Suicidal attempt 01-09-12                                                DISCHARGE CRITERIA:  Adequate post-discharge living arrangements Improved stabilization in mood, thinking, and/or behavior Motivation to continue treatment in a less acute level of care Verbal commitment to aftercare and medication compliance  PRELIMINARY DISCHARGE PLAN: Attend aftercare/continuing care group Attend PHP/IOP Attend 12-step recovery group Participate in family therapy  PATIENT/FAMIILY INVOLVEMENT: This treatment plan has been presented to and reviewed with the patient, TARAN HABLE, and/or family member.  The patient and family have been given the opportunity to ask questions and make suggestions.  Mickeal Needy 01/10/2012, 1:13 AM

## 2012-01-10 NOTE — Progress Notes (Signed)
Recreation Therapy Notes  01/10/2012         Time: 1000      Group Topic/Focus: Patient invited to participate in animal assisted therapy. Pets as a coping skill and responsibility were discussed.   Participation Level: Active  Participation Quality: Appropriate and Attentive  Affect: Appropriate  Cognitive: Appropriate and Oriented   Additional Comments: None   Mallory Shaw 01/10/2012 11:35 AM

## 2012-01-10 NOTE — Progress Notes (Signed)
Recreation Therapy Notes  01/10/2012         Time: 1415      Group Topic/Focus: The focus of the group is on enhancing the patients' ability to cope with stressors by understanding what coping is, why it is important, the negative effects of stress and developing healthier coping skills.  Participation Level: Minimal  Participation Quality: Attentive  Affect: Appropriate  Cognitive: Oriented   Additional Comments: Patient late to group as she was with her nurse, reported feeling dizzy but participated to the best of her abilities.   Aerial Dilley 01/10/2012 3:44 PM
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
## 2012-01-10 NOTE — Progress Notes (Signed)
Patient ID: Mallory Shaw, female   DOB: Sep 25, 1966, 46 y.o.   MRN: [id number] Pt. Reports shaking and stomach cramps, writer reports s/s of detox. Meds administered see MAR. Cuts to left arm cleansed and redressed. Pt. Denies SHI. Staff will continue to monitor q64min for safety.

## 2012-01-10 NOTE — Progress Notes (Signed)
Pt is in her room in bed.  C/o weakness, N/V, and abd cramping.  Pt left emesis in the toilet which contained bright red blood.  MD are aware pt is having blood in her stool and emesis and labs are pending.  Pt given Maalox, Imodium, Bentyl, and Zofran for her symptoms at this time.  Will continue to monitor.  Pt encouraged to use her call bell if she needs to get up to prevent a fall.  Pt voices understanding.  Safety maintained with q15 minute checks.

## 2012-01-10 NOTE — Discharge Planning (Signed)
New patient attended AM group, good participation.  Tearful throughout presentation.  Recently lost job and vehicle due to use of alcohol, heroin and opiates.  At the suggestion of friend, called 911 for help.  Has had 6 years of sobriety in the past with the help of AA.  Grief and loss issues secondary to loss of son.  "I can't go on pretending everything is fine.  It's not and I need help."   Mother and a close family friend are her supports.  Wants rehab from here.  Feels lousy due to withdrawal symptoms.
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
## 2012-01-10 NOTE — Progress Notes (Signed)
Patient ID: Mallory Shaw, female   DOB: [birth year], 46 y.o.   MRN: [id number] Pt reports poor sleep and poor appetite.  Her energy level is low and she denies any thoughts of self harm.  This morning she reported vomiting after breakfast and had noticeable tremors.  Pt started on nicotine gum for smoking habit of a pack daily.  Pt given Quitline number as she is expressing interest in smoking cessation.
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
## 2012-01-11 ENCOUNTER — Encounter (HOSPITAL_COMMUNITY): Payer: Self-pay | Admitting: Emergency Medicine

## 2012-01-11 DIAGNOSIS — R319 Hematuria, unspecified: Secondary | ICD-10-CM | POA: Clinically undetermined

## 2012-01-11 DIAGNOSIS — F1120 Opioid dependence, uncomplicated: Principal | ICD-10-CM

## 2012-01-11 LAB — URINALYSIS, ROUTINE W REFLEX MICROSCOPIC
Bilirubin Urine: NEGATIVE
Glucose, UA: NEGATIVE mg/dL
Ketones, ur: NEGATIVE mg/dL
Leukocytes, UA: NEGATIVE
Nitrite: NEGATIVE
Protein, ur: NEGATIVE mg/dL
Specific Gravity, Urine: 1.013 (ref 1.005–1.030)
Urobilinogen, UA: 0.2 mg/dL (ref 0.0–1.0)
pH: 6.5 (ref 5.0–8.0)

## 2012-01-11 LAB — DIFFERENTIAL
Basophils Absolute: 0 10*3/uL (ref 0.0–0.1)
Eosinophils Relative: 3 % (ref 0–5)
Lymphocytes Relative: 29 % (ref 12–46)
Monocytes Absolute: 0.4 10*3/uL (ref 0.1–1.0)
Monocytes Relative: 6 % (ref 3–12)
Neutro Abs: 3.6 10*3/uL (ref 1.7–7.7)

## 2012-01-11 LAB — CBC
HCT: 35.3 % — ABNORMAL LOW (ref 36.0–46.0)
Hemoglobin: 11.9 g/dL — ABNORMAL LOW (ref 12.0–15.0)
MCHC: 33.7 g/dL (ref 30.0–36.0)
MCV: 92.9 fL (ref 78.0–100.0)
Platelets: 208 10*3/uL (ref 150–400)
RBC: 3.98 MIL/uL (ref 3.87–5.11)
RDW: 13 % (ref 11.5–15.5)
RDW: 12.9 % (ref 11.5–15.5)
WBC: 5.6 10*3/uL (ref 4.0–10.5)
WBC: 5.8 10*3/uL (ref 4.0–10.5)

## 2012-01-11 LAB — URINE MICROSCOPIC-ADD ON

## 2012-01-11 LAB — WET PREP, GENITAL
Trich, Wet Prep: NONE SEEN
Yeast Wet Prep HPF POC: NONE SEEN

## 2012-01-11 LAB — BASIC METABOLIC PANEL
CO2: 25 mEq/L (ref 19–32)
Calcium: 8.7 mg/dL (ref 8.4–10.5)
Chloride: 104 mEq/L (ref 96–112)
Creatinine, Ser: 0.62 mg/dL (ref 0.50–1.10)
Glucose, Bld: 91 mg/dL (ref 70–99)

## 2012-01-11 MED ORDER — METRONIDAZOLE 500 MG PO TABS
500.0000 mg | ORAL_TABLET | Freq: Two times a day (BID) | ORAL | Status: DC
  Administered 2012-01-11 – 2012-01-16 (×11): 500 mg via ORAL
  Filled 2012-01-11 (×15): qty 1

## 2012-01-11 MED ORDER — SODIUM CHLORIDE 0.9 % IV BOLUS (SEPSIS)
500.0000 mL | INTRAVENOUS | Status: AC
  Administered 2012-01-11: 500 mL via INTRAVENOUS

## 2012-01-11 MED ORDER — LORAZEPAM 2 MG/ML IJ SOLN
1.0000 mg | Freq: Once | INTRAMUSCULAR | Status: AC
  Administered 2012-01-11: 1 mg via INTRAVENOUS
  Filled 2012-01-11: qty 1

## 2012-01-11 MED ORDER — PANTOPRAZOLE SODIUM 40 MG IV SOLR
40.0000 mg | Freq: Once | INTRAVENOUS | Status: AC
  Administered 2012-01-11: 40 mg via INTRAVENOUS
  Filled 2012-01-11: qty 40

## 2012-01-11 MED ORDER — ACETAMINOPHEN 500 MG PO TABS
1000.0000 mg | ORAL_TABLET | Freq: Once | ORAL | Status: AC
  Administered 2012-01-11: 1000 mg via ORAL
  Filled 2012-01-11: qty 2

## 2012-01-11 NOTE — ED Notes (Signed)
Lisette, PA notified of pelvic set up

## 2012-01-11 NOTE — ED Notes (Signed)
Security called to escort pt to behavioral health.

## 2012-01-11 NOTE — Progress Notes (Signed)
[facility] Group Notes:  (Counselor/Nursing/MHT/Case Management/Adjunct)  01/11/2012 10:21 AM  Type of Therapy:  Group Therapy at 11:00am  Group Cancelled due to [employer], Mallory Shaw 01/11/2012, 10:21 AM  [facility] Group Notes:  (Counselor/Nursing/MHT/Case Management/Adjunct)  01/11/2012 3:39 PM  Type of Therapy:  Group Therapy at 1:15PM  Participation Level:  Minimal as she came in late after return from ED  Participation Quality:  Appropriate and Sharing  Affect:  Appropriate and relieved  Cognitive:  Appropriate  Insight:  Good  Engagement in Group:  Not Applicable  Engagement in Therapy:  Not  applicable  Modes of Intervention:  Clarification, Socialization and Support  Summary of Progress/Problems: Mallory Shaw came in towards end of group session as she had been at the ED; patient reports relief in knowing cause of abdominal pain and is hopeful for some relief soon. Patient shared at length about anxiety related to the unknown and other patients identified and were supportive of her. Patient also shared experience with nurse in ED: I am so amazed I actually asked for something nonnarcotic for the pain!" ~ unfortunately the nurse's response was "I would never give you any narcotics" to which the pt felt judged, yet she was able to  remain calm.  [name] 01/11/2012, 3:49 PM

## 2012-01-11 NOTE — Progress Notes (Signed)
Pt up and attended evening group tonight.  Pt mood is improved this evening.  She was sent to the ED for evaluation of her severe abdominal cramps and returned with a dx of bacterial infection.  She was started on Flagyl tonight.  She reports feeling better tonight and relieved that she now has a reason for her cramping.  She is still c/o withdrawal symptoms and prns are given as ordered.  She denies SI/HI.  Pleasant/cooperative.  Safety maintained with q15 minute checks.

## 2012-01-11 NOTE — Treatment Plan (Signed)
Interdisciplinary Treatment Plan Update (Adult)  Date: 01/11/2012  Time Reviewed: 9:51 AM   Progress in Treatment: Attending groups: Yes Participating in groups: Yes Taking medication as prescribed: Yes Tolerating medication: Yes   Family/Significant othe contact made: Counselor if suicide prevention is necessary   Patient understands diagnosis:  Yes  As evidenced by asking for help with substances Discussing patient identified problems/goals with staff:  Yes See below Medical problems stabilized or resolved:  No  Pain in stomach, blood in urine,  Will send to ED for checkout Denies suicidal/homicidal ideation: Yes  In tx team Issues/concerns per patient self-inventory:  Withdrawal symptoms,  Concerns about medical problems Other:  New problem(s) identified: N/A  Reason for Continuation of Hospitalization: Depression Medication stabilization Withdrawal symptoms  Interventions implemented related to continuation of hospitalization: Get medically checked out at ED,  Detox protocol,  Encourage group attendance and participation  Additional comments:  Estimated length of stay:2-4 days  Discharge Plan:Wants to get into rehab  New goal(s): N/A  Review of initial/current patient goals per problem list:   1.  Goal(s): Stabilize medically  Met:  No  Target date:1/31  As evidenced [id number] the help of the ED  2.  Goal (s):Safely detox from alcohol  Met:  No  Target date:2/1  As evidenced [id number] in CIWA score from current to 0  3.  Goal(s):Get into rehab from here  Met:  No  Target date:2/3  As evidenced [id number] a bed  4.  Goal(s):  Met:  No  Target date:  As evidenced by:  Attendees: Patient:  Mallory Shaw 01/11/2012 9:51 AM  Family:     Physician:   01/11/2012 9:51 AM   Nursing: Carolynn Comment   01/11/2012 9:51 AM   Case Manager:  Richelle Ito, LCSW 01/11/2012 9:51 AM   Counselor:  Ronda Fairly, LCSWA 01/11/2012 9:51 AM   Other: Shelda Jakes   01/11/2012 9:51 AM  Other:     Other:     Other:      Scribe for Treatment Team:   Ida Rogue, 01/11/2012 9:51 AM

## 2012-01-11 NOTE — ED Notes (Signed)
Pt given meal tray prior to transfer back to facility.

## 2012-01-11 NOTE — ED Notes (Signed)
ION:[id number]<MW> Expected date:01/11/12<BR> Expected time:11:37 AM<BR> Means of arrival:Ambulance<BR> Comments:<BR> Hold for [facility]

## 2012-01-11 NOTE — ED Provider Notes (Signed)
History     CSN: [id number]  Arrival date & time 01/11/12  1139   First MD Initiated Contact with Patient 01/11/12 1159      Chief Complaint  Patient presents with  . Abdominal Pain    for several weeks  . Emesis    pt reports seeing blood in vomit    (Consider location/radiation/quality/duration/timing/severity/associated sxs/prior treatment) HPI Comments: Patient presents emergency department with chief complaint of suprapubic abdominal pain, nausea, vomiting, diarrhea.  She was sent from the behavioral health hospitalist [facility].  Patient is currently receiving inpatient detox from alcohol and opiate dependency.  Patient has a history of partial hysterectomy for uterine cancer and substance abuse.  Patient states she is sexually active and does not use protection.  Patient denies pain and burning with urination, hematuria.  Patient states that she's been having abdominal cramping for the last 6 months and vomiting for the last 2 weeks.  Patient states that she is unable to hold down food at all and that yesterday she vomited 4 times.  She noticed hematemesis one time yesterday.  Patient denies any exacerbating or alleviating symptoms of her suprapubic abdominal pain.  Patient rates her pain as a 10 out of 10.  Patient is a 46 y.o. female presenting with abdominal pain and vomiting.  Abdominal Pain The primary symptoms of the illness include abdominal pain, nausea, vomiting and diarrhea. The primary symptoms of the illness do not include fever, shortness of breath or dysuria.  Symptoms associated with the illness do not include chills, constipation, urgency or hematuria.  Emesis  Associated symptoms include abdominal pain and diarrhea. Pertinent negatives include no arthralgias, no chills, no cough, no fever, no headaches and no myalgias.    Past Medical History  Diagnosis Date  . Substance abuse   . Uterine cancer   . Degenerative disk disease     Past Surgical History    Procedure Date  . Partial hysterectomy     History reviewed. No pertinent family history.  History  Substance Use Topics  . Smoking status: Current Everyday Smoker -- 1.0 packs/day for 20 years  . Smokeless tobacco: Not on file  . Alcohol Use: Yes    OB History    Grav Para Term Preterm Abortions TAB SAB Ect Mult Living                  Review of Systems  Constitutional: Positive for appetite change. Negative for fever and chills.  HENT: Negative for neck stiffness and dental problem.   Eyes: Negative for visual disturbance.  Respiratory: Negative for cough, chest tightness, shortness of breath and wheezing.   Cardiovascular: Negative for chest pain.  Gastrointestinal: Positive for nausea, vomiting, abdominal pain and diarrhea. Negative for constipation, blood in stool, abdominal distention, anal bleeding and rectal pain.  Genitourinary: Negative for dysuria, urgency, hematuria and flank pain.  Musculoskeletal: Negative for myalgias and arthralgias.  Skin: Negative for rash.  Neurological: Negative for dizziness, syncope, speech difficulty, numbness and headaches.  Hematological: Does not bruise/bleed easily.  All other systems reviewed and are negative.    Allergies  Review of patient's allergies indicates no known allergies.  Home Medications   Current Outpatient Rx  Name Route Sig Dispense Refill  . FENTANYL 100 MCG/HR TD PT72 Oral Take 0.25 patches by mouth daily. Patient cuts up patches and chews a quarter of a patch daily.    . OXYCODONE HCL 30 MG PO TABS Oral Take 30 mg by mouth 3 (  three) times daily.    . ACETAMINOPHEN 500 MG PO TABS Oral Take 1,000 mg by mouth every 6 (six) hours as needed. pain      BP 108/68  Pulse 57  Temp(Src) 97.8 F (36.6 C) (Oral)  Resp 18  Ht 4' 11.5" (1.511 m)  Wt 102 lb (46.267 kg)  BMI 20.26 kg/m2  SpO2 100%  Physical Exam  Nursing note and vitals reviewed. Constitutional: Vital signs are normal. She appears  well-developed and well-nourished. No distress.  HENT:  Head: Normocephalic and atraumatic.  Mouth/Throat: Uvula is midline, oropharynx is clear and moist and mucous membranes are normal.  Eyes: Conjunctivae and EOM are normal. Pupils are equal, round, and reactive to light.  Neck: Normal range of motion and full passive range of motion without pain. Neck supple. No spinous process tenderness and no muscular tenderness present. No rigidity. No Brudzinski's sign noted.  Cardiovascular: Normal rate and regular rhythm.   Pulmonary/Chest: Effort normal and breath sounds normal. No accessory muscle usage. Not tachypneic. No respiratory distress.  Abdominal: Soft. Normal appearance and bowel sounds are normal. She exhibits no distension, no ascites, no pulsatile midline mass and no mass. There is tenderness in the suprapubic area. There is no CVA tenderness. No hernia.    Genitourinary: Rectum normal. Rectal exam shows no tenderness. Pelvic exam was performed with patient supine. Cervix exhibits discharge. No erythema or bleeding around the vagina. Vaginal discharge found.       Chaperone was present. Patient with no pain around the rectal area. There are no external fissures noted. No induration of the skin or swelling. No external hemorrhoids seen. Patient able to tolerate examination. I was able to feel the first 2-3cm of the rectum digitally without gross abnormality. There is no gross blood.  No signs of perirectal abscess. Heme occult NEG  White thin vaginal dc on pelvic exam, no malodor. Mild cervical motion tenderness. No adnexal tenderness.      Lymphadenopathy:    She has no cervical adenopathy.  Neurological: She is alert.  Skin: Skin is warm and dry. No rash noted. She is not diaphoretic.  Psychiatric: She has a normal mood and affect. Her speech is normal and behavior is normal.    ED Course  Procedures (including critical care time)  Labs Reviewed  WET PREP, GENITAL - Abnormal;  Notable for the following:    Clue Cells Wet Prep HPF POC MODERATE (*)    WBC, Wet Prep HPF POC RARE (*)    All other components within normal limits  URINALYSIS, ROUTINE W REFLEX MICROSCOPIC - Abnormal; Notable for the following:    APPearance TURBID (*)    Hgb urine dipstick LARGE (*)    All other components within normal limits  CBC - Abnormal; Notable for the following:    RBC 3.80 (*)    Hemoglobin 11.9 (*)    HCT 35.3 (*)    All other components within normal limits  URINE MICROSCOPIC-ADD ON - Abnormal; Notable for the following:    Squamous Epithelial / LPF FEW (*)    Bacteria, UA FEW (*)    All other components within normal limits  CBC  DIFFERENTIAL  BASIC METABOLIC PANEL  OCCULT BLOOD, POC DEVICE  GC/CHLAMYDIA PROBE AMP, GENITAL  OCCULT BLOOD X 1 CARD TO LAB, STOOL   No results found.   No diagnosis found.    MDM  Bacterial Vaginosis  Patient has been treated for bacterial vaginosis with an order of 500 twice  a day.  Flagyl x7 days.  She's been instructed not to drink any alcohol while on this medication.  Patient has been medically cleared to be transferred back to behavioral health.  In regards to patient's report of hematemesis GI bleed is not concerning due to the fact the patient's Hemoccult was negative.  It is recommended that the patient receive a followup CBC tomorrow to recheck hemoglobin.  Patient has been given Tylenol and Ativan as well as fluids during her emergency department stay.  Patient is hemodynamically stable and in no acute distress prior to transfer.   Jaci Carrel, [place] 01/11/12 1330

## 2012-01-11 NOTE — ED Notes (Signed)
Pt with sitter in room from [facility].  Pt denies any SI/HI.
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
## 2012-01-11 NOTE — ED Notes (Signed)
Report called to Minerva Areola, Charity fundraiser at [facility].

## 2012-01-11 NOTE — Progress Notes (Addendum)
Patient ID: Mallory Shaw, female   DOB: 14-Mar-1966, 46 y.o.   MRN: [id number] Pt is awake and active on the unit. Pt is c/o NV with meals and is unable to keep food down for long. Pt has severe abdominal pain as well. Pt mood and affect are anxious and distressed. Pt was transferred to the [facility] for evaluation. Pt was treated for elevated WBC and returned to [facility] this afternoon. Pt is undergoing opiate detox and is receiving PRN medications for symptoms. Writer offered emotional support. Also changed pt dressing to left wrist for self inflicted lacerations. Pt mood is improved since returning to the unit and she states that she feels relieved knowing what the problem is. Pt is participating in the milieu and is cooperative with staff. Pt writes that she wants to "begin a recovery program and a healthy lifestyle." Writer will continue to monitor.

## 2012-01-11 NOTE — Progress Notes (Signed)
Patient ID: Mallory Shaw, female   DOB: 1966/11/08, 46 y.o.   MRN: [id number] Mallory Shaw  45 y.o.  [id number] [birth year]  01/11/2012   Diagnosis:  Opiate addiction Subjective: Pt. Notes that she is having significant abdominal pain and has had blood in her urine as well as vomiting. She states that the vomiting is worse after she eats. States that she can't keep food down. Objective:Vital Signs:Blood pressure 118/70, pulse 66, temperature 97.8 F (36.6 C), temperature source Oral, resp. rate 18, height 4' 11.5" (1.511 m), weight 46.267 kg (102 lb), SpO2 100.00%.Vital signs are stable.  Bowel sounds present in all 4 quadrants. Pt. Tender in lower abdominal area above symphysis pubis, no HSO, no masses, ascites or swelling.  No CVA tenderness. Assessment: Opiate detox progressing                         Abdominal pain uncertain hematology                         Hematuria unexplained etiology Medications Scheduled:  . acetaminophen  1,000 mg Oral Once  . chlordiazePOXIDE  25 mg Oral QID   Followed by  . chlordiazePOXIDE  25 mg Oral TID   Followed by  . chlordiazePOXIDE  25 mg Oral BH-qamhs   Followed by  . chlordiazePOXIDE  25 mg Oral Daily  . citalopram  20 mg Oral Daily  . famotidine  20 mg Oral Daily  . LORazepam  1 mg Intravenous Once  . metroNIDAZOLE  500 mg Oral BID  . mulitivitamin with minerals  1 tablet Oral Daily  . nicotine  21 mg Transdermal Q0600  . pantoprazole (PROTONIX) IV  40 mg Intravenous Once  . sodium chloride  500 mL Intravenous STAT  . thiamine  100 mg Oral Daily   PRN Meds acetaminophen, alum & mag hydroxide-simeth, chlordiazePOXIDE, dicyclomine, hydrOXYzine, loperamide, magnesium hydroxide, methocarbamol, naproxen, ondansetron, traZODone  Plan: Continue current plan of care. Pt. To be sent to ED for further evaluation of abdominal pain and hematuria.  Rona Ravens. [name] 01/11/2012 3:23 PM

## 2012-01-11 NOTE — ED Notes (Addendum)
Pt is currently a pt of Behavioral Health x 2 days for detox from ETOH and opiates.  Pt to ED via EMS stating she has been having abd pain for several weeks and vomiting and diarrhea for the past few days.  Pt reports blood in vomit and diarrhea. Pt also reports weakness.

## 2012-01-11 NOTE — Progress Notes (Signed)
Pt attended morning group. Complaining of severe stomach cramps in addition to vomiting blood and passing blood in her urine which is making it difficult for her to focus on her treatment. Would like long term treatment after d/c. [facility] was initial option, however next available appointment date is not until Feb 13th. SWCM will continue to discuss additional options with Pt.

## 2012-01-12 LAB — CBC
HCT: 34.6 % — ABNORMAL LOW (ref 36.0–46.0)
Hemoglobin: 11.8 g/dL — ABNORMAL LOW (ref 12.0–15.0)
RDW: 12.7 % (ref 11.5–15.5)
WBC: 6.8 10*3/uL (ref 4.0–10.5)

## 2012-01-12 MED ORDER — CITALOPRAM HYDROBROMIDE 10 MG PO TABS
10.0000 mg | ORAL_TABLET | Freq: Every day | ORAL | Status: DC
  Administered 2012-01-12 – 2012-01-16 (×5): 10 mg via ORAL
  Filled 2012-01-12 (×3): qty 1
  Filled 2012-01-12: qty 7
  Filled 2012-01-12 (×3): qty 1

## 2012-01-12 MED ORDER — QUETIAPINE FUMARATE ER 50 MG PO TB24
50.0000 mg | ORAL_TABLET | Freq: Every day | ORAL | Status: DC
  Administered 2012-01-12: 50 mg via ORAL
  Filled 2012-01-12 (×2): qty 1

## 2012-01-12 NOTE — Progress Notes (Signed)
Recreation Therapy Notes  01/12/2012         Time: 1415      Group Topic/Focus: The focus of this group is on enhancing patients' problem solving skills, which involves identifying the problem, brainstorming solutions and choosing and trying a solution.   Participation Level: Minimal  Participation Quality: Distracted  Affect: Appropriate  Cognitive: Oriented   Additional Comments: Patient so fixated on seeing the doctor, she was not able to focus in group.  [name] 01/12/2012 3:46 PM

## 2012-01-12 NOTE — Progress Notes (Signed)
Patient ID: Mallory Shaw, female   DOB: [birth year], 46 y.o.   MRN: [id number] Pt is awake and active on the unit this PM. Pt c/o some discomfort r/t withdrawal. Emotional support and PRN medications provided. Pt is participating in the milieu and is cooperative with staff. Writer will continue to monitor.

## 2012-01-12 NOTE — Progress Notes (Signed)
Patient ID: Mallory Shaw, female   DOB: 09-17-1966, 46 y.o.   MRN: [id number] Patient asleep in bed, resting with eyes closed. Appears in no distress. Respirations even, normal and unlabored. Will continue monitoring q15 minutes to ensure patient safety.

## 2012-01-12 NOTE — Progress Notes (Signed)
B[facility] Group Notes:  (Counselor/Nursing/MHT/Case Management/Adjunct)  01/12/2012 2:04 PM  Type of Therapy:  Group Therapy  Participation Level:  Active  Participation Quality:  Appropriate and Attentive  Affect:  Appropriate  Cognitive:  Alert and Appropriate  Insight:  Good  Engagement in Group:  Good  Engagement in Therapy:  Good  Modes of Intervention:  Activity, Education and Exploration  Summary of Progress/Problems: Patient reported she was looking forward to spending time with her son who planned to come eat lunch with her. Patient also chose an image of a woman behind a locked door, and the patient stated this photo reminded her of when her life was imbalanced.  She choose a phot of a sunrise over the ocean to represent a life in balance.   Wilmon Arms 01/12/2012, 2:04 PM

## 2012-01-12 NOTE — ED Provider Notes (Signed)
Medical screening examination/treatment/procedure(s) were conducted as a shared visit with non-physician practitioner(s) and myself.  I personally evaluated the patient during the encounter   Suzi Roots, MD 01/12/12 [phone]

## 2012-01-12 NOTE — Progress Notes (Signed)
[facility] Group Notes:  (Counselor/Nursing/MHT/Case Management/Adjunct)  01/12/2012 3:34 PM  Type of Therapy:  1:15PM Group Therapy  Participation Level:  Active  Participation Quality:  Appropriate and Attentive  Affect:  Appropriate  Cognitive:  Alert and Appropriate  Insight:  Good  Engagement in Group:  Good  Engagement in Therapy:  Good  Modes of Intervention:  Education and Exploration  Summary of Progress/Problems:  Patient shared that she enjoyed the mindfulness exercise and noted (as others did) that she was actually "still" during the exercise with chocolate.  Patient shared her book with group facilitator who read from it at random.  Pt reports "that was the best chocolate I ever had"   Wilmon Arms 01/12/2012, 3:34 PM

## 2012-01-12 NOTE — Progress Notes (Signed)
[facility] MD Progress Note  01/12/2012 2:47 PM  S/O: Patient seen and evaluated. Chart reviewed. Patient stated that her mood was "not good". Her affect was mood congruent and anxious. She denied any current thoughts of self injurious behavior, suicidal ideation or homicidal ideation. C/o racing thoughts and continued depressive s/s. There were no auditory or visual hallucinations, paranoia, delusional thought processes, or mania noted.  Thought process was linear and goal directed.  Speech was normal rate, tone and volume. Eye contact was good. Judgment and insight are fair.  Patient has been up and engaged on the unit.  No acute safety concerns reported from team.  Sleep:  Number of Hours: 6    Vital Signs:Blood pressure 129/70, pulse 62, temperature 97.6 F (36.4 C), temperature source Oral, resp. rate 18, height 4' 11.5" (1.511 m), weight 46.267 kg (102 lb), SpO2 100.00%.  Lab Results:  Results for orders placed during the hospital encounter of 01/09/12 (from the past 48 hour(s))  CBC     Status: Normal   Collection Time   01/11/12  6:15 AM      Component Value Range Comment   WBC 5.6  4.0 - 10.5 (K/uL)    RBC 3.98  3.87 - 5.11 (MIL/uL)    Hemoglobin 12.8  12.0 - 15.0 (g/dL)    HCT 16.1  09.6 - 04.5 (%)    MCV 93.2  78.0 - 100.0 (fL)    MCH 32.2  26.0 - 34.0 (pg)    MCHC 34.5  30.0 - 36.0 (g/dL)    RDW 40.9  81.1 - 91.4 (%)    Platelets 208  150 - 400 (K/uL)   CBC     Status: Abnormal   Collection Time   01/11/12 12:10 PM      Component Value Range Comment   WBC 5.8  4.0 - 10.5 (K/uL)    RBC 3.80 (*) 3.87 - 5.11 (MIL/uL)    Hemoglobin 11.9 (*) 12.0 - 15.0 (g/dL)    HCT 78.2 (*) 95.6 - 46.0 (%)    MCV 92.9  78.0 - 100.0 (fL)    MCH 31.3  26.0 - 34.0 (pg)    MCHC 33.7  30.0 - 36.0 (g/dL)    RDW 21.3  08.6 - 57.8 (%)    Platelets 212  150 - 400 (K/uL)   DIFFERENTIAL     Status: Normal   Collection Time   01/11/12 12:10 PM      Component Value Range Comment   Neutrophils Relative 62   43 - 77 (%)    Neutro Abs 3.6  1.7 - 7.7 (K/uL)    Lymphocytes Relative 29  12 - 46 (%)    Lymphs Abs 1.7  0.7 - 4.0 (K/uL)    Monocytes Relative 6  3 - 12 (%)    Monocytes Absolute 0.4  0.1 - 1.0 (K/uL)    Eosinophils Relative 3  0 - 5 (%)    Eosinophils Absolute 0.2  0.0 - 0.7 (K/uL)    Basophils Relative 1  0 - 1 (%)    Basophils Absolute 0.0  0.0 - 0.1 (K/uL)   BASIC METABOLIC PANEL     Status: Normal   Collection Time   01/11/12 12:10 PM      Component Value Range Comment   Sodium 139  135 - 145 (mEq/L)    Potassium 3.9  3.5 - 5.1 (mEq/L)    Chloride 104  96 - 112 (mEq/L)    CO2 25  19 - 32 (mEq/L)    Glucose, Bld 91  70 - 99 (mg/dL)    BUN 13  6 - 23 (mg/dL)    Creatinine, Ser 9.14  0.50 - 1.10 (mg/dL)    Calcium 8.7  8.4 - 10.5 (mg/dL)    GFR calc non Af Amer >90  >90 (mL/min)    GFR calc Af Amer >90  >90 (mL/min)   URINALYSIS, ROUTINE W REFLEX MICROSCOPIC     Status: Abnormal   Collection Time   01/11/12 12:25 PM      Component Value Range Comment   Color, Urine YELLOW  YELLOW     APPearance TURBID (*) CLEAR     Specific Gravity, Urine 1.013  1.005 - 1.030     pH 6.5  5.0 - 8.0     Glucose, UA NEGATIVE  NEGATIVE (mg/dL)    Hgb urine dipstick LARGE (*) NEGATIVE     Bilirubin Urine NEGATIVE  NEGATIVE     Ketones, ur NEGATIVE  NEGATIVE (mg/dL)    Protein, ur NEGATIVE  NEGATIVE (mg/dL)    Urobilinogen, UA 0.2  0.0 - 1.0 (mg/dL)    Nitrite NEGATIVE  NEGATIVE     Leukocytes, UA NEGATIVE  NEGATIVE    URINE MICROSCOPIC-ADD ON     Status: Abnormal   Collection Time   01/11/12 12:25 PM      Component Value Range Comment   Squamous Epithelial / LPF FEW (*) RARE     RBC / HPF 3-6  <3 (RBC/hpf)    Bacteria, UA FEW (*) RARE     Urine-Other MUCOUS PRESENT     WET PREP, GENITAL     Status: Abnormal   Collection Time   01/11/12 12:40 PM      Component Value Range Comment   Yeast Wet Prep HPF POC NONE SEEN  NONE SEEN     Trich, Wet Prep NONE SEEN  NONE SEEN     Clue Cells Wet  Prep HPF POC MODERATE (*) NONE SEEN     WBC, Wet Prep HPF POC RARE (*) NONE SEEN    GC/CHLAMYDIA PROBE AMP, GENITAL     Status: Normal   Collection Time   01/11/12 12:40 PM      Component Value Range Comment   GC Probe Amp, Genital NEGATIVE  NEGATIVE     Chlamydia, DNA Probe NEGATIVE  NEGATIVE    OCCULT BLOOD, POC DEVICE     Status: Normal   Collection Time   01/11/12 12:44 PM      Component Value Range Comment   Fecal Occult Bld NEGATIVE       Physical Findings: CIWA:  CIWA-Ar Total: 4  COWS:  COWS Total Score: 9   A/P: Discussed several optiosn for medication management.  Pt declined primary mood stabilizers.  Wanted to continue SSRI and said this was occurring prior to the initiation of the medication.  Decreased dose in half and added Seroquel XR at bedtime for improved mood stability, decreased agitation and c/o anxiety.  Medication education completed.  Pros, cons, risks, potential side effects and benefits were discussed with pt.  Pt agreeable with the plan.  See orders.  Discussed with team.  CBC reordered, Flagyl started yesterday.  Pain improved.  No c/o CP, SOB, N/V/D, blood in urine or stool at this time.    Lupe Carney 01/12/2012, 2:47 PM

## 2012-01-12 NOTE — Progress Notes (Signed)
Patient has been up and in the milieu most of the day.  Multiple requests for PRN medications.  Does exhibit some signs of active withdrawal with anxiety, slight tremors, and sweaty palms.  Has attended all groups today.  Interacting well with staff and peers.  Received medication for complaints of knee pain.  Complained this morning of racing thoughts which I encouraged her to discuss with provider today.  She did and will be starting on quetiapine this evening.

## 2012-01-13 MED ORDER — QUETIAPINE FUMARATE ER 50 MG PO TB24
100.0000 mg | ORAL_TABLET | Freq: Every day | ORAL | Status: DC
  Administered 2012-01-13 – 2012-01-15 (×3): 100 mg via ORAL
  Filled 2012-01-13 (×4): qty 2

## 2012-01-13 MED ORDER — QUETIAPINE FUMARATE 25 MG PO TABS
25.0000 mg | ORAL_TABLET | Freq: Once | ORAL | Status: AC
  Administered 2012-01-13: 25 mg via ORAL
  Filled 2012-01-13: qty 1

## 2012-01-13 NOTE — Progress Notes (Signed)
[facility] Adult Inpatient Family/Significant Other Suicide Prevention Education  Suicide Prevention Education:  Patient Refusal for Family/Significant Other Suicide Prevention Education: The patient Mallory Shaw has refused to provide written consent for Mother to be provided Suicide Prevention Education prior to discharge.  Physician notified.  Writer provided suicide prevention education directly to patient; conversation included risk factors, warning signs and resources to contact for help. Mobile crisis services explained and contact card placed in chart for pt to receive at discharge.   [name] 01/13/2012, 7:11 PM

## 2012-01-13 NOTE — Progress Notes (Signed)
[facility] Group Notes:  (Counselor/Nursing/MHT/Case Management/Adjunct)  01/13/2012 5:26 PM  Type of Therapy:  Group Therapy at 11:00  Participation Level:  Active  Participation Quality:  Appropriate  Affect:  Anxious  Cognitive:  Appropriate  Insight:  Good  Engagement in Group:  Good  Engagement in Therapy:  Limited  Modes of Intervention:  Problem-solving and Support  Summary of Progress/Problems: Niki shared her anxiety about proposed discharge on Monday as she does not feel her depression and anxiety have been addressed. Patient took responsibility for not discussing her depression and anxiety until recently in stay. Others in group offered Jessilyn support and encouragement to advocate for herself and knowledge that she will be having followup care after discharge. Melana was tearful during reading of "allow me to introduce myself" and asked for a copy.    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 both have led to relapse.  "There is  oftentimes unbearable shame and guilt over the things I have done under the influence  And often then abused substances just to quell that change and guilt. I also to use in order to get anything done because the pain of withdrawal is so hellish when you wake up and have nothing to use."   [name] 01/13/2012, 5:26 PM

## 2012-01-13 NOTE — Discharge Planning (Signed)
Mallory Shaw attended AM group, good participation.  States she feels much better physically, and, in fact, she looks like she feels better.  However, she is quick to point out that she is need of mental health stabilization as she is experiencing racing thoughts, mood lability and "need to deal with underlying issues."  Also states that rather than [facility], she thinks she would rather go home to stay with mother until her assessment at [facility] on the 13th.  Assured her she could talk to Dr today to see about medications to help with her symptoms.

## 2012-01-13 NOTE — Progress Notes (Signed)
[facility] MD Progress Note  01/13/2012 4:10 PM  S/O: Patient seen and evaluated. Chart reviewed. Patient stated that her mood was "little better". Her affect was mood congruent and less anxious. She denied any current thoughts of self injurious behavior, suicidal ideation or homicidal ideation. C/o continued racing thoughts and depressive s/s. There were no auditory or visual hallucinations, paranoia, delusional thought processes, or mania noted. Thought process was linear and goal directed. Speech was normal rate, tone and volume. Eye contact was good. Judgment and insight are fair. Patient has been up and engaged on the unit. No acute safety concerns reported from team.  Sleep:  Number of Hours: 6.5    Vital Signs:Blood pressure 132/79, pulse 76, temperature 97.4 F (36.3 C), temperature source Oral, resp. rate 20, height 4' 11.5" (1.511 m), weight 46.267 kg (102 lb), SpO2 100.00%.  Lab Results:  Results for orders placed during the hospital encounter of 01/09/12 (from the past 48 hour(s))  CBC     Status: Abnormal   Collection Time   01/12/12  8:26 PM      Component Value Range Comment   WBC 6.8  4.0 - 10.5 (K/uL)    RBC 3.72 (*) 3.87 - 5.11 (MIL/uL)    Hemoglobin 11.8 (*) 12.0 - 15.0 (g/dL)    HCT 69.6 (*) 29.5 - 46.0 (%)    MCV 93.0  78.0 - 100.0 (fL)    MCH 31.7  26.0 - 34.0 (pg)    MCHC 34.1  30.0 - 36.0 (g/dL)    RDW 28.4  13.2 - 44.0 (%)    Platelets 216  150 - 400 (K/uL)    Physical Findings: CIWA:  CIWA-Ar Total: 4  COWS:  COWS Total Score: 9   A/P: Discussed several options for medication management yesterday. Pt declined primary mood stabilizers. Wanted to continue SSRI and said the "racing thoughts" were occurring prior to the initiation of the medication. Decreased dose in half and added Seroquel XR at bedtime for improved mood stability, decreased agitation and c/o anxiety. Medication education completed. Pros, cons, risks, potential side effects and benefits were discussed with  pt. Pt agreeable with the plan. See orders. Discussed with team. CBC reordered, Flagyl started yesterday. Pain improved. No c/o CP, SOB, N/V/D, blood in urine or stool at this time.  Further discussed meds today.  C/o continued anxiety, Seroquel ordered twice today to help in light of librium taper completion and increased XR dose at bedtime.  Lupe Carney 01/13/2012, 4:10 PM

## 2012-01-13 NOTE — Progress Notes (Signed)
[facility] Case Management Discharge Plan:  Will you be returning to the same living situation after discharge: No. At discharge, do you have transportation home?:Yes,  mother Do you have the ability to pay for your medications:Yes,  mental health  Interagency Information:     Release of information consent forms completed and in the chart;  Patient's signature needed at discharge.  Patient to Follow up at:  Follow-up Information    Follow up with [facility] on 01/25/2012. (Your assessment for admission is at 8AM sharp with Richelle Ito)    Contact information:   [address]  [place]  [address] [phone]         Patient denies SI/HI:   Yes,  yes    Safety Planning and Suicide Prevention discussed:  Yes,  yes  Barrier to discharge identified:No.  Summary and Recommendations:   Ida Rogue 01/13/2012, 2:03 PM

## 2012-01-13 NOTE — Progress Notes (Signed)
Patient ID: Mallory Shaw, female   DOB: December 30, 1965, 46 y.o.   MRN: [id number]    Patient pleasant on approach today. States that the seroquel along with night time medication helped her sleep very well last night. Reports depression "4" and hopelessness "4" on self inventory sheet. Denies any SI this am. Got some robaxin for muscle spasm and general pain this am. Staff will continue to monitor and encourage group attendance.

## 2012-01-14 MED ORDER — QUETIAPINE FUMARATE 100 MG PO TABS
100.0000 mg | ORAL_TABLET | Freq: Every day | ORAL | Status: DC
  Administered 2012-01-14 – 2012-01-16 (×2): 100 mg via ORAL
  Filled 2012-01-14 (×4): qty 1

## 2012-01-14 MED ORDER — BENZTROPINE MESYLATE 0.5 MG PO TABS
0.5000 mg | ORAL_TABLET | Freq: Two times a day (BID) | ORAL | Status: DC
  Administered 2012-01-14: 0.5 mg via ORAL
  Administered 2012-01-15: 08:00:00 via ORAL
  Administered 2012-01-15 – 2012-01-16 (×2): 0.5 mg via ORAL
  Filled 2012-01-14 (×8): qty 1

## 2012-01-14 NOTE — Progress Notes (Signed)
Pt. attended and participated in aftercare planning group. Pt. listed their current anxiety level as"high" stating that she is having medication changes and is excited to go to [facility] Monday.

## 2012-01-14 NOTE — Progress Notes (Signed)
Patient ID: Mallory Shaw, female   DOB: 09/22/1966, 46 y.o.   MRN: [id number] 01-14-12 nursing shift assessment; pt was polite/cooperative, took her am meds and went to group. She seems motivated to be involved in her care. She admitted she has a problem with opiates. On her inventory sheet she wrote slept well, appetite improving, energy low, attention span poor, with depression at 3 and hopelessness at 5. Withdrawal symptoms are sedation and agitation. She denied any suicide ideation. Physical problems in the last 24 hours are pain and headaches with a pain goal of 1. When she goes home she plans to exercise, eat a proper diet and get in a recovery program.  RN will monitor and safety checks continue every 15 minutes.

## 2012-01-14 NOTE — Progress Notes (Signed)
[facility] Group Notes:  (Counselor/Nursing/MHT/Case Management/Adjunct)  01/14/2012 11 AM  Type of Therapy:  Group Therapy, Dance/Movement Therapy   Participation Level:  Active  Participation Quality:  Appropriate  Affect:  Appropriate  Cognitive:  Appropriate  Insight:  Good  Engagement in Group:  Good  Engagement in Therapy:  Good  Modes of Intervention:  Clarification, Problem-solving, Role-play, Socialization and Support  Summary of Progress/Problems:  Group discussed ways recovery can be sabotaged through feelings of worthlessness, jealousy, envy, hopeless, self- pity and by way of habit.  Group discussed ways of overcoming self sabotaging feelings in order to prevent relapse by  discussing what the word recovery means to them.  Pt described the meaning of  recovery as "serenity"      Mallory Shaw

## 2012-01-14 NOTE — Progress Notes (Signed)
Pt is awake, alert and complaining of restlessness and inability to sit still.  She has begun picking her chin. She reports that the Seroquel she took last night was given again and it made her feel extremely calm. She requests daytime dose. It is possible she is having some akathisia, restlessness and the discussion of Seroquel in combination with Cogentin is described for its mechanism purpose and side effects. Patient agrees to take these medications. She states she is to restlessness and anxious to think about going home today she will be evaluated tomorrow. She denies any suicidal/okay homicidal ideation today.

## 2012-01-14 NOTE — Progress Notes (Signed)
Patient ID: Mallory Shaw, female   DOB: [birth year], 46 y.o.   MRN: [id number] Has been up and about on hall this evening, interacting well with staff and peers, has been smiling and pleasant with peers.  States is doing well with detox, only c/o being some muscle cramping and spasms in her legs.  Requested and given robaxin.  Denies thoughts of self harm.  Attended group,  Stayed in dayroom and watched tv and had snack afterward.  Will continue to monitor.

## 2012-01-15 NOTE — Progress Notes (Signed)
Mallory Shaw says she has taken the daytime dose of Mallory Shaw has helped her stay calm "but not that long"  She implies she needs more.  She is told the Dr.  Stann Shaw evaluate in am and may want to give her XL or change medication.  She says she is still pacing but denies active suicidal thoughts today. She slept better and is eating.  She denies AH, VH.
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
## 2012-01-15 NOTE — Progress Notes (Signed)
Patient ID: Mallory Shaw, female   DOB: June 24, 1966, 46 y.o.   MRN: [id number] Pt. attended and participated in aftercare planning group. Pt. accepted information on suicide prevention, warning signs to look for with suicide and crisis line numbers to use. The pt. agreed to call crisis line numbers if having warning signs or having thoughts of suicide. Pt. listed their current anxiety level as high.  Pt. indicated that she is anxious because she is being discharged tomorrow.  She feels nervous to be starting something new.

## 2012-01-15 NOTE — Progress Notes (Signed)
Patient ID: Mallory Shaw, female   DOB: 11/22/1966, 46 y.o.   MRN: [id number] Has been up this evening, attended group, pleasant , muscle cramping has improved, states is feeling better.  Seems to be interacting well with peers,  Is asking appropriate questions,  Has voiced no significant c/o's tonight.  Will continue to monitor.

## 2012-01-15 NOTE — Progress Notes (Signed)
Patient ID: Mallory Shaw, female   DOB: October 12, 1966, 46 y.o.   MRN: [id number] 01-15-12 nursing shift note: pt had a headache earlier and was given a prn and it was effective. She stated her Seroquel had also been effective.  On her inventory sheet she wrote she slept well, appetite improving, energy low, attention poor with depression at 4 and hopelessness at 3. He withdrawal symptom has been agitation. She denied any suicide ideation. In the last 24 hours her physical problems have been headaches and mild pain. She rated her pain at 3 with a goal of 0. When she goes home she plans to get into a recovery program, extended rehab, proper diet and exercise.  RN will continue to monitor and safety checks continue every 15 minutes.

## 2012-01-15 NOTE — Progress Notes (Signed)
Patient ID: Mallory Shaw, female   DOB: 01/13/1966, 46 y.o.   MRN: [id number]  [facility] Group Notes:  (Counselor/Nursing/MHT/Case Management/Adjunct)  01/15/2012 1:15 PM  Type of Therapy:  Group Therapy, Dance/Movement Therapy   Participation Level:  Active  Participation Quality:  Appropriate  Affect:  Appropriate  Cognitive:  Appropriate  Insight:  Good  Engagement in Group:  Good  Engagement in Therapy:  Good  Modes of Intervention:  Clarification, Problem-solving, Role-play, Socialization and Support  Summary of Progress/Problems:  Group discussed the purpose of healthy supports and the importance of building a support network as part of their  team.  Group expressed their anxiety levels based on a color.  Pt. indicated that she was feeling the colors "red and black" because she is feeling anxious.  Pt. stated that she" will build her support system from healthy friends and family".       Rhunette Croft

## 2012-01-16 MED ORDER — CITALOPRAM HYDROBROMIDE 10 MG PO TABS: 10.0000 mg | ORAL_TABLET | Freq: Every day | ORAL | Status: DC

## 2012-01-16 MED ORDER — NICOTINE 21 MG/24HR TD PT24: 1.0000 | MEDICATED_PATCH | Freq: Every day | TRANSDERMAL | Status: AC

## 2012-01-16 MED ORDER — QUETIAPINE FUMARATE 100 MG PO TABS: 100.0000 mg | ORAL_TABLET | Freq: Three times a day (TID) | ORAL | Status: DC

## 2012-01-16 MED ORDER — TRAZODONE HCL 50 MG PO TABS: 50.0000 mg | ORAL_TABLET | Freq: Every evening | ORAL | Status: DC | PRN

## 2012-01-16 MED ORDER — QUETIAPINE FUMARATE 100 MG PO TABS
100.0000 mg | ORAL_TABLET | Freq: Three times a day (TID) | ORAL | Status: DC
  Administered 2012-01-16: 100 mg via ORAL
  Filled 2012-01-16: qty 21
  Filled 2012-01-16 (×2): qty 1
  Filled 2012-01-16: qty 21
  Filled 2012-01-16: qty 1
  Filled 2012-01-16: qty 21

## 2012-01-16 MED ORDER — FAMOTIDINE 20 MG PO TABS: 20.0000 mg | ORAL_TABLET | Freq: Every day | ORAL | Status: DC

## 2012-01-16 NOTE — Discharge Summary (Signed)
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 Substance abuse   . Uterine cancer   . Degenerative disk disease    AXIS IV:  educational problems, housing problems, problems related to social environment, problems with access to health care services and problems with primary support group AXIS V:  51-60 moderate symptoms  Level of Care:  OP  Hospital Course:      The duration of [name]"s stay at [facility] was unremarkable.      The patient was seen and evaluated by the Treatment team consisting of Psychiatrist, PAC, RN, Case Manager, and Therapist for evaluation and treatment plan with goal of stabilization upon discharge. The patient's physical and mental health problems were identified and treated appropriately.       On the 2nd full day of admission the patient complained of significant abdominal pain with hematemisis, hematuria, with nausea. She was evaluated by ED and treated with Metronidazole and symptoms resolved.       Multiple modalities of treatment were used including medication, individual and group therapies, unit programming, AA/NA, improved nutrition, physical activity, and family sessions as needed.     The symptoms of alcohol/substance abuse withdrawal were monitored daily by serial clinical withdrawal scores. Improvement was demonstrated by declining CIWA/COWS numbers, improving vital signs,  increased cognition, and improvement in mood, sleep, appetite as well as a reduction in psychosocial symptoms.  The patient wanted to do a longer rehab stay, unfortunately beds were not immediately available and the patient elected to return to her mother's home and follow up with the local Mental Health Facility until a placement could be found.     The patient was evaluated and found to be stable enough for discharge and was released to home per the plan of treatment.   Mental Status Exam:  For mental status exam please see mental status exam and suicide risk assessment completed by attending physician prior to discharge.  Consults:  None  Significant Diagnostic Studies:  labs:  Reviewed   CBC   DIFFERENTIAL   COMPREHENSIVE METABOLIC PANEL   ACETAMINOPHEN LEVEL   URINE RAPID DRUG SCREEN (HOSP PERFORMED)   ETHANOL    Results for orders placed during the hospital encounter of 01/07/12   CBC   Component  Value  Range    WBC  7.0  4.0 - 10.5 (K/uL)    RBC  4.56  3.87 - 5.11 (MIL/uL)    Hemoglobin  14.7  12.0 - 15.0 (g/dL)    HCT  86.5  78.4 - 69.6 (%)    MCV  90.8  78.0 - 100.0 (fL)    MCH  32.2  26.0 - 34.0 (pg)    MCHC  35.5  30.0 - 36.0 (g/dL)    RDW  29.5  28.4 - 13.2 (%)    Platelets  308  150 - 400 (K/uL)   DIFFERENTIAL   Component  Value  Range    Neutrophils Relative  43  43 - 77 (%)    Neutro Abs  3.0  1.7 - 7.7 (K/uL)    Lymphocytes Relative  43  12 - 46 (%)    Lymphs Abs  3.0  0.7 - 4.0 (K/uL)    Monocytes Relative  6  3 - 12 (%)    Monocytes Absolute  0.4  0.1 - 1.0 (K/uL)    Eosinophils Relative  6 (*)  0 - 5 (%)    Eosinophils Absolute  0.4  0.0 - 0.7 (K/uL)    Basophils Relative  1  0 - 1 (%)    Basophils Absolute  0.1  0.0 - 0.1 (K/uL)   COMPREHENSIVE METABOLIC PANEL   Component  Value  Range    Sodium  142  135 - 145 (mEq/L)    Potassium  3.2 (*)  3.5 - 5.1 (mEq/L)    Chloride  105  96 - 112 (mEq/L)    CO2  25  19 - 32 (mEq/L)    Glucose, Bld  84  70 - 99  (mg/dL)    BUN  14  6 - 23 (mg/dL)    Creatinine, Ser  1.19  0.50 - 1.10 (mg/dL)    Calcium  8.9  8.4 - 10.5 (mg/dL)    Total Protein  7.8  6.0 - 8.3 (g/dL)    Albumin  4.1  3.5 - 5.2 (g/dL)    AST  25  0 - 37 (U/L)    ALT  10  0 - 35 (U/L)    Alkaline Phosphatase  59  39 - 117 (U/L)    Total Bilirubin  0.3  0.3 - 1.2 (mg/dL)    GFR calc non Af Amer  >90  >90 (mL/min)    GFR calc Af Amer  >90  >90 (mL/min)   ACETAMINOPHEN LEVEL   Component  Value  Range    Acetaminophen (Tylenol), Serum  <15.0  10 - 30 (ug/mL)   URINE RAPID DRUG SCREEN (HOSP PERFORMED)   Component  Value  Range    Opiates  NONE DETECTED  NONE DETECTED    Cocaine  NONE DETECTED  NONE DETECTED    Benzodiazepines  POSITIVE (*)  NONE DETECTED    Amphetamines  NONE DETECTED  NONE DETECTED    Tetrahydrocannabinol  POSITIVE (*)  NONE DETECTED    Barbiturates  NONE DETECTED  NONE DETECTED   ETHANOL   Component  Value  Range    Alcohol, Ethyl (B)  204 (*)  0 - 11 (mg/dL)     Discharge Vitals:   Blood pressure 119/73, pulse 88, temperature 97.2 F (36.2 C), temperature source Oral, resp. rate 16, height 4' 11.5" (1.511 m), weight 46.267 kg (102 lb), SpO2 100.00%.  Mental Status Exam: See Mental Status Examination and Suicide Risk Assessment completed by Attending Physician prior to discharge.  Discharge destination:  Home  Is patient on multiple antipsychotic therapies at discharge:  No   Has Patient had three or more failed trials of antipsychotic monotherapy by history:  No  Recommended Plan for Multiple Antipsychotic Therapies: N/A Discharge Orders    Future Orders Please Complete By Expires   Diet - low sodium heart healthy      Increase activity slowly      Discharge instructions      Comments:   Take all medication as prescribed. Attend 90 meetings in 90 days.     Medication List  As of 01/16/2012  1:51 PM   START taking these medications         citalopram 10 MG tablet   Commonly known as: CELEXA    Take 1 tablet (10 mg total) by mouth daily. For anxiety and depression.      famotidine 20 MG tablet   Commonly known as: PEPCID   Take 1 tablet (20 mg total) by mouth daily. For indigestion and reflux.      nicotine 21 mg/24hr patch   Commonly known as: NICODERM CQ - dosed in mg/24 hours   Place 1 patch onto the skin daily at 6 (six) AM.      QUEtiapine 100 MG tablet   Commonly known as: SEROQUEL   Take 1 tablet (100 mg total) by mouth 3 (three) times daily. For anxiety and mood stabilization.      traZODone 50 MG tablet   Commonly known as: DESYREL   Take 1 tablet (50 mg total) by mouth at bedtime as needed for sleep.         STOP taking these medications         acetaminophen 500 MG tablet      fentaNYL 100 MCG/HR      oxycodone 30 MG immediate release tablet          Where to get your medications    These are the prescriptions that you need to pick up. We sent them to a specific pharmacy, so you will need to go there to get them.   [facility] DR[facility] [address] [place], [place] - [address] AT [facility] [address] [facility] [address]    [address] [place] [address]    Phone: [phone]        citalopram 10 MG tablet         You may get these medications from any pharmacy.         famotidine 20 MG tablet   nicotine 21 mg/24hr patch   QUEtiapine 100 MG tablet   traZODone 50 MG tablet           Follow-up Information    Follow up with [facility] on 01/25/2012. (Your assessment for admission is at 8AM sharp with Richelle Ito)    Contact information:   [address]  [place]  [address] [phone]      Follow up with [facility] on 01/24/2012. (4:00 with Dr Dicky Doe)    Contact information:   [address]  [place]  [phone]         Follow-up recommendations:  Activity:  as tolerated each day. Diet:  low fat, low sodium diet.  Comments:  Please get regular exercise, eat a nutritional diet, and get 7-8 hours of sleep each night.  Signed: Rona Ravens.  Kahli Mayon PAC For Dr. Gurney Maxin 01/16/2012, 1:51 PM/

## 2012-01-16 NOTE — Progress Notes (Addendum)
Patient ID: RYLEA SELWAY, female   DOB: Nov 04, 1966, 46 y.o.   MRN: [id number] Has been out on the hall and in the dayroom most of the evening, watched superbowl, pleasant but feeling anxious this evening, nervous about going to long term, still seems unsure where she's going. Asking appropriate questions about her meds, worried that she will not be able to directly into the program and wll have to wait until the 13th to get in. Plans to live with her mother til then but afraid she may not be able to keep herself together. Has questions for MD in am.  Will continue to monitor.

## 2012-01-16 NOTE — Progress Notes (Signed)
[facility] Case Management Discharge Plan:  Will you be returning to the same living situation after discharge: No. At discharge, do you have transportation home?:Yes,  mother Do you have the ability to pay for your medications:Yes,  mental health  Interagency Information:     Release of information consent forms completed and in the chart;  Patient's signature needed at discharge.  Patient to Follow up at:  Follow-up Information    Follow up with [facility] on 01/25/2012. (Your assessment for admission is at 8AM sharp with Richelle Ito)    Contact information:   [address]  [place]  [address] [phone]      Follow up with [facility] on 01/24/2012. (4:00 with Dr Dicky Doe)    Contact information:   [address] [place]  [place]  [phone]         Patient denies SI/HI:   Yes,  yes    Safety Planning and Suicide Prevention discussed:  Yes,  yes  Barrier to discharge identified:No.  Summary and Recommendations:   Ida Rogue 01/16/2012, 12:21 PM

## 2012-01-16 NOTE — Progress Notes (Signed)
Pt denies SI/HI/AVH. Pt states that her depression and hopelessness is a 4. Pt participated in group. Pt states that she slept well and her appetite is improving.
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
## 2012-01-16 NOTE — Progress Notes (Signed)
Patient ID: Mallory Shaw, female   DOB: 11-15-1966, 46 y.o.   MRN: [id number] Patient discharged this evening 1638 with ex boyfriend. Went over D/C instructions with patient, follow up appointments and prescriptions. Patient is anxious about discharge but excited. Denies any SI/HI/AV. Patient given a supply of medications on discharge. Patient's ex boyfriend did not have any questions on discharge about patient, nor does patient have any questions. Patient given all belonging from locker. Walked out by this Engineer, civil (consulting).
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
## 2012-01-17 NOTE — Progress Notes (Signed)
Patient Discharge Instructions:  Admission Note Faxed,  01/17/2012 After Visit Summary Faxed,  01/17/2012 Faxed to the Next Level Care provider:  01/17/2012 D/C Summary Note faxed 01/17/2012 Facesheet faxed 01/17/2012   Faxed to [facility] @ [phone] And to [facility] @ [phone]  Wandra Scot, 01/17/2012, 4:55 PM

## 2012-04-30 ENCOUNTER — Emergency Department (HOSPITAL_BASED_OUTPATIENT_CLINIC_OR_DEPARTMENT_OTHER)
Admission: EM | Admit: 2012-04-30 | Discharge: 2012-04-30 | Disposition: A | Discharge status: Home / Self Care | Payer: Self-pay | Attending: Emergency Medicine | Admitting: Emergency Medicine

## 2012-04-30 ENCOUNTER — Encounter (HOSPITAL_BASED_OUTPATIENT_CLINIC_OR_DEPARTMENT_OTHER): Payer: Self-pay | Admitting: *Deleted

## 2012-04-30 DIAGNOSIS — K006 Disturbances in tooth eruption: Secondary | ICD-10-CM | POA: Insufficient documentation

## 2012-04-30 DIAGNOSIS — F17200 Nicotine dependence, unspecified, uncomplicated: Secondary | ICD-10-CM | POA: Insufficient documentation

## 2012-04-30 DIAGNOSIS — K089 Disorder of teeth and supporting structures, unspecified: Secondary | ICD-10-CM | POA: Insufficient documentation

## 2012-04-30 DIAGNOSIS — K007 Teething syndrome: Secondary | ICD-10-CM

## 2012-04-30 MED ORDER — HYDROCODONE-ACETAMINOPHEN 5-325 MG PO TABS: 1.0000 | ORAL_TABLET | Freq: Four times a day (QID) | ORAL | Status: AC | PRN

## 2012-04-30 MED ORDER — IBUPROFEN 800 MG PO TABS: 800.0000 mg | ORAL_TABLET | Freq: Three times a day (TID) | ORAL | Status: AC

## 2012-04-30 MED ORDER — PENICILLIN V POTASSIUM 500 MG PO TABS: 500.0000 mg | ORAL_TABLET | Freq: Three times a day (TID) | ORAL | Status: AC

## 2012-04-30 NOTE — Discharge Instructions (Signed)
Please followup with dentist within the next 2 days to receive treatment for dental pain.

## 2012-04-30 NOTE — ED Provider Notes (Signed)
History     CSN: [id number]  Arrival date & time 04/30/12  1810   First MD Initiated Contact with Patient 04/30/12 1820     6:52 PM HPI Patient reports she had all 4 wisdom teeth removed 4 years ago but was told she had an additional wisdom tooth on the right upper side. Reports 20 years later tooth is erupting. Reports it has been coming on for several weeks but it just began to hurt her yesterday. Denies purulent drainage, swelling, fever, difficulty swallowing, difficulty breathing.  Patient is a 46 y.o. female presenting with tooth pain.  Dental PainPrimary symptoms do not include headaches, fever, sore throat or cough. The symptoms began yesterday. The symptoms are worsening. The symptoms are new. The symptoms occur constantly.  Additional symptoms include: gum swelling, gum tenderness and jaw pain. Additional symptoms do not include: purulent gums, trismus, facial swelling, trouble swallowing, pain with swallowing, ear pain and swollen glands.    Past Medical History  Diagnosis Date  . Substance abuse   . Uterine cancer   . Degenerative disk disease     Past Surgical History  Procedure Date  . Partial hysterectomy     History reviewed. No pertinent family history.  History  Substance Use Topics  . Smoking status: Current Everyday Smoker -- 1.0 packs/day for 20 years  . Smokeless tobacco: Not on file  . Alcohol Use: Yes    OB History    Grav Para Term Preterm Abortions TAB SAB Ect Mult Living                  Review of Systems  Constitutional: Negative for fever and chills.  HENT: Negative for ear pain, sore throat, facial swelling and trouble swallowing.        Positive for toothache  Respiratory: Negative for cough.   Neurological: Negative for headaches.  All other systems reviewed and are negative.    Allergies  Review of patient's allergies indicates no known allergies.  Home Medications   Current Outpatient Rx  Name Route Sig Dispense Refill  .  CITALOPRAM HYDROBROMIDE 10 MG PO TABS Oral Take 1 tablet (10 mg total) by mouth daily. For anxiety and depression. 30 tablet 0  . FAMOTIDINE 20 MG PO TABS Oral Take 1 tablet (20 mg total) by mouth daily. For indigestion and reflux. 30 tablet 0  . QUETIAPINE FUMARATE 100 MG PO TABS Oral Take 1 tablet (100 mg total) by mouth 3 (three) times daily. For anxiety and mood stabilization. 90 tablet 0  . TRAZODONE HCL 50 MG PO TABS Oral Take 1 tablet (50 mg total) by mouth at bedtime as needed for sleep. 30 tablet 0    BP 118/61  Pulse 100  Temp(Src) 98.5 F (36.9 C) (Oral)  Resp 16  Ht 5' (1.524 m)  Wt 120 lb (54.432 kg)  BMI 23.44 kg/m2  SpO2 100%  Physical Exam  Vitals reviewed. Constitutional: She is oriented to person, place, and time. Vital signs are normal. She appears well-developed and well-nourished. No distress.  HENT:  Head: Normocephalic and atraumatic. No trismus in the jaw.  Mouth/Throat: Oropharynx is clear and moist and mucous membranes are normal. No dental abscesses, uvula swelling or dental caries.       Patient has an absent 1st,2nd and 3rd molars but appears to a new wisdom tooth eruption in the right upper side.  Eyes: Pupils are equal, round, and reactive to light.  Neck: Neck supple.  Pulmonary/Chest: Effort normal.  Neurological: She is alert and oriented to person, place, and time.  Skin: Skin is warm and dry. No rash noted. No erythema. No pallor.  Psychiatric: She has a normal mood and affect. Her behavior is normal.    ED Course  Procedures   MDM   Discussed with patient that we will refer her to a dentist as well as an oral Careers adviser. Will prescribe patient penicillin and Vicodin. Advised patient she may take up to 800 mg of ibuprofen. Patient voices understanding and will follow up with the dentist.       Thomasene Lot, PA-C 04/30/12 1853

## 2012-04-30 NOTE — ED Notes (Signed)
Pt c/o toothache x 3 days. 

## 2012-05-01 NOTE — ED Provider Notes (Signed)
Medical screening examination/treatment/procedure(s) were performed by non-physician practitioner and as supervising physician I was immediately available for consultation/collaboration.    Kirby Cortese L Shakinah Navis, MD 05/01/12 1643 

## 2012-05-22 ENCOUNTER — Emergency Department (HOSPITAL_BASED_OUTPATIENT_CLINIC_OR_DEPARTMENT_OTHER)
Admission: EM | Admit: 2012-05-22 | Discharge: 2012-05-22 | Disposition: A | Discharge status: Home / Self Care | Payer: Self-pay | Attending: Emergency Medicine | Admitting: Emergency Medicine

## 2012-05-22 ENCOUNTER — Encounter (HOSPITAL_BASED_OUTPATIENT_CLINIC_OR_DEPARTMENT_OTHER): Payer: Self-pay | Admitting: *Deleted

## 2012-05-22 DIAGNOSIS — IMO0002 Reserved for concepts with insufficient information to code with codable children: Secondary | ICD-10-CM | POA: Insufficient documentation

## 2012-05-22 DIAGNOSIS — B029 Zoster without complications: Secondary | ICD-10-CM | POA: Insufficient documentation

## 2012-05-22 DIAGNOSIS — F17200 Nicotine dependence, unspecified, uncomplicated: Secondary | ICD-10-CM | POA: Insufficient documentation

## 2012-05-22 MED ORDER — OXYCODONE-ACETAMINOPHEN 5-325 MG PO TABS
2.0000 | ORAL_TABLET | Freq: Once | ORAL | Status: AC
  Administered 2012-05-22: 2 via ORAL
  Filled 2012-05-22: qty 2

## 2012-05-22 MED ORDER — ACYCLOVIR 400 MG PO TABS: 400.0000 mg | ORAL_TABLET | Freq: Every day | ORAL | Status: AC

## 2012-05-22 MED ORDER — OXYCODONE-ACETAMINOPHEN 5-325 MG PO TABS: 2.0000 | ORAL_TABLET | Freq: Four times a day (QID) | ORAL | Status: AC | PRN

## 2012-05-22 NOTE — ED Notes (Signed)
Rash on her trunk and arms x 1 week.

## 2012-05-22 NOTE — ED Provider Notes (Signed)
History     CSN: [id number]  Arrival date & time 05/22/12  1735   First MD Initiated Contact with Patient 05/22/12 1744      Chief Complaint  Patient presents with  . Rash    (Consider location/radiation/quality/duration/timing/severity/associated sxs/prior treatment) HPI Patient is a 46 yo female who presents today with 1 week of increasingly painful and extensive rash.  Patient first noted pain along the sides of her neck and notes that here the rash she had appeared "veiny".  Patient then noted development of a patch of erythematous papules beneath the left breast.  There have been no definite blisters with this.  Patient denies fever, nausea, vomiting.  Pain is described as burning and she does have history of chicken pox as a child.  She denies any history of immunocompromise.  There are no other associated or modifying factors.  Past Medical History  Diagnosis Date  . Substance abuse   . Uterine cancer   . Degenerative disk disease     Past Surgical History  Procedure Date  . Partial hysterectomy     History reviewed. No pertinent family history.  History  Substance Use Topics  . Smoking status: Current Everyday Smoker -- 1.0 packs/day for 20 years  . Smokeless tobacco: Not on file  . Alcohol Use: Yes    OB History    Grav Para Term Preterm Abortions TAB SAB Ect Mult Living                  Review of Systems  Constitutional: Negative.   HENT: Negative.   Eyes: Negative.   Cardiovascular: Negative.   Gastrointestinal: Negative.   Genitourinary: Negative.   Musculoskeletal: Negative.   Skin: Positive for rash.  Neurological: Negative.   Hematological: Negative.   Psychiatric/Behavioral: Negative.   All other systems reviewed and are negative.    Allergies  Review of patient's allergies indicates no known allergies.  Home Medications   Current Outpatient Rx  Name Route Sig Dispense Refill  . ACYCLOVIR 400 MG PO TABS Oral Take 1 tablet (400 mg  total) by mouth 5 (five) times daily. 50 tablet 0  . CITALOPRAM HYDROBROMIDE 10 MG PO TABS Oral Take 1 tablet (10 mg total) by mouth daily. For anxiety and depression. 30 tablet 0  . FAMOTIDINE 20 MG PO TABS Oral Take 1 tablet (20 mg total) by mouth daily. For indigestion and reflux. 30 tablet 0  . OXYCODONE-ACETAMINOPHEN 5-325 MG PO TABS Oral Take 2 tablets by mouth every 6 (six) hours as needed for pain. 30 tablet 0  . QUETIAPINE FUMARATE 100 MG PO TABS Oral Take 1 tablet (100 mg total) by mouth 3 (three) times daily. For anxiety and mood stabilization. 90 tablet 0  . TRAZODONE HCL 50 MG PO TABS Oral Take 1 tablet (50 mg total) by mouth at bedtime as needed for sleep. 30 tablet 0    BP 119/93  Pulse 96  Temp(Src) 98.1 F (36.7 C) (Oral)  Resp 20  SpO2 100%  Physical Exam  Nursing note and vitals reviewed. GEN: Well-developed, well-nourished female in no distress, very uncomfortable HEENT: Atraumatic, normocephalic.  EYES: PERRLA BL, no scleral icterus. NECK: Trachea midline, no meningismus CV: regular rate and rhythm.  PULM: No respiratory distress.  . Neuro: cranial nerves grossly 2-12 intact, no abnormalities of strength or sensation, A and O x 3 MSK: Patient moves all 4 extremities symmetrically, no deformity, edema, or injury noted Skin: Patient has multiple patches of erythematous papules (  some with open area where vesicle may have been excoriated) noted over the lower chest wall and trunk Psych: no abnormality of mood   ED Course  Procedures (including critical care time)  Labs Reviewed - No data to display No results found.   1. Shingles       MDM  Patient was evaluated by myself and had painful erythematous rash beginning in a dermatomal distribution with no RFs for contact dermatitis and a history of varicella.  She was in significant pain with this.  Patient was treated with percocet and was given a prescription for acyclovir as she has no insurance and this will  be more affordable.  Patient is a recovering alcoholic but has no history of difficulty with pain meds.  She is aware of the addiction potential of these meds and has already contacted her sponsor to plan how she will manage this acute issue.  Patient was discharged in improved condition.        Cyndra Numbers, MD 05/22/12 1840

## 2012-05-22 NOTE — Discharge Instructions (Signed)

## 2012-05-26 ENCOUNTER — Emergency Department (HOSPITAL_BASED_OUTPATIENT_CLINIC_OR_DEPARTMENT_OTHER)
Admission: EM | Admit: 2012-05-26 | Discharge: 2012-05-26 | Disposition: A | Discharge status: Home / Self Care | Payer: Self-pay | Attending: Emergency Medicine | Admitting: Emergency Medicine

## 2012-05-26 ENCOUNTER — Encounter (HOSPITAL_BASED_OUTPATIENT_CLINIC_OR_DEPARTMENT_OTHER): Payer: Self-pay | Admitting: *Deleted

## 2012-05-26 DIAGNOSIS — F17200 Nicotine dependence, unspecified, uncomplicated: Secondary | ICD-10-CM | POA: Insufficient documentation

## 2012-05-26 DIAGNOSIS — B86 Scabies: Secondary | ICD-10-CM

## 2012-05-26 DIAGNOSIS — Z8542 Personal history of malignant neoplasm of other parts of uterus: Secondary | ICD-10-CM | POA: Insufficient documentation

## 2012-05-26 DIAGNOSIS — L989 Disorder of the skin and subcutaneous tissue, unspecified: Secondary | ICD-10-CM | POA: Insufficient documentation

## 2012-05-26 DIAGNOSIS — IMO0002 Reserved for concepts with insufficient information to code with codable children: Secondary | ICD-10-CM | POA: Insufficient documentation

## 2012-05-26 MED ORDER — PERMETHRIN 5 % EX CREA: TOPICAL_CREAM | CUTANEOUS | Status: AC

## 2012-05-26 MED ORDER — OXYCODONE-ACETAMINOPHEN 5-325 MG PO TABS: 1.0000 | ORAL_TABLET | ORAL | Status: AC | PRN

## 2012-05-26 MED ORDER — LIDOCAINE 4 % EX CREA: TOPICAL_CREAM | CUTANEOUS | Status: DC | PRN

## 2012-05-26 NOTE — ED Notes (Signed)
MD at bedside. PA in to evaluate.

## 2012-05-26 NOTE — ED Provider Notes (Signed)
History     CSN: [id number]  Arrival date & time 05/26/12  2240   First MD Initiated Contact with Patient 05/26/12 2317      11:31 PM HPI Patient reports a painful rash on her abdomen for greater than one week. Reports she was seen here on 05/22/2012 for same and diagnosed with shingles. Ports rash has improved slightly the patient states she continues to have severe pain. Describes the pain as a burning pain. Reports severe pruritus. Denies fever, nausea, vomiting, shortness of breath. Reports rash is contained to her abdomen. Denies change in soaps, detergents, lotions. Denies food contacts, plans contacts, known allergies, recent present in varix stay. Reports she lives with a roommate who does not have a rash. Reports trying several home remedies to relieve itching such as oatmeal baths. States he is only temporary relief the itching and pain and she has run out of pain medication that she received here 4 days ago. Patient is a 46 y.o. female presenting with rash. The history is provided by the patient.  Rash  This is a new problem. The current episode started more than 1 week ago. The problem has been gradually improving. The problem is associated with an unknown factor. There has been no fever. The rash is present on the abdomen. The pain is moderate. The pain has been constant since onset. Associated symptoms include itching and pain. Pertinent negatives include no blisters and no weeping. She has tried anti-itch cream and a cold compress for the symptoms. The treatment provided mild relief.    Past Medical History  Diagnosis Date  . Substance abuse   . Uterine cancer   . Degenerative disk disease     Past Surgical History  Procedure Date  . Partial hysterectomy     History reviewed. No pertinent family history.  History  Substance Use Topics  . Smoking status: Current Everyday Smoker -- 1.0 packs/day for 20 years  . Smokeless tobacco: Not on file  . Alcohol Use: Yes    OB  History    Grav Para Term Preterm Abortions TAB SAB Ect Mult Living                  Review of Systems  Constitutional: Negative for fever and chills.  HENT: Negative for rhinorrhea.   Eyes: Negative for redness.  Respiratory: Negative for cough, shortness of breath and wheezing.   Cardiovascular: Negative for chest pain and palpitations.  Gastrointestinal: Negative for nausea.  Skin: Positive for itching and rash.       Pruritus  All other systems reviewed and are negative.    Allergies  Review of patient's allergies indicates no known allergies.  Home Medications   Current Outpatient Rx  Name Route Sig Dispense Refill  . ACYCLOVIR 400 MG PO TABS Oral Take 1 tablet (400 mg total) by mouth 5 (five) times daily. 50 tablet 0  . VITAMIN C PO Oral Take 1 tablet by mouth daily.    Marland Kitchen VITAMIN B-12 PO Oral Take 1 tablet by mouth daily.    Marland Kitchen FAMOTIDINE 20 MG PO TABS Oral Take 20 mg by mouth daily as needed. For acid reflux    . FLUOXETINE HCL 40 MG PO CAPS Oral Take 40 mg by mouth daily.    Marland Kitchen GARLIC PO Oral Take 1 tablet by mouth daily.    Marland Kitchen GREEN TEA PO Oral Take 1 tablet by mouth daily.    . IBUPROFEN 200 MG PO TABS Oral Take 600-800  mg by mouth 3 (three) times daily as needed. For pain    . ADULT MULTIVITAMIN W/MINERALS CH Oral Take 1 tablet by mouth daily.    Marland Kitchen FISH OIL PO Oral Take 1 capsule by mouth daily.    Marland Kitchen OVER THE COUNTER MEDICATION Oral Take 1 tablet by mouth daily. Stress vitamin    . OXYCODONE-ACETAMINOPHEN 5-325 MG PO TABS Oral Take 2 tablets by mouth every 6 (six) hours as needed for pain. 30 tablet 0  . TRAZODONE HCL 100 MG PO TABS Oral Take 100 mg by mouth at bedtime as needed. For sleep    . VITAMIN D (CHOLECALCIFEROL) PO Oral Take 1 tablet by mouth daily.      BP 136/76  Pulse 96  Temp 98.1 F (36.7 C) (Oral)  Resp 20  Ht 5' (1.524 m)  Wt 120 lb (54.432 kg)  BMI 23.44 kg/m2  SpO2 97%  Physical Exam  Vitals reviewed. Constitutional: She is oriented to  person, place, and time. Vital signs are normal. She appears well-developed and well-nourished. No distress.  HENT:  Head: Normocephalic and atraumatic.  Eyes: Pupils are equal, round, and reactive to light.  Neck: Neck supple.  Pulmonary/Chest: Effort normal.  Neurological: She is alert and oriented to person, place, and time.  Skin: Skin is warm and dry. No rash noted. No erythema. No pallor.     Psychiatric: She has a normal mood and affect. Her behavior is normal.    ED Course  Procedures   MDM  Suspect patient likely have scabies as opposed to shingles. Recommended patient complete acyclovir but we'll also prescribe her permethrin, and topical lidocaine. We'll also prescribe short dose of narcotic medication for pain. Advised likelihood of addictions or overuse of medication since patient had concerns previously. Patient agrees with use of topical lidocaine         Thomasene Lot, PA-C 05/26/12 2342

## 2012-05-26 NOTE — Discharge Instructions (Signed)
Scabies Scabies are small bugs (mites) that burrow under the skin and cause red bumps and severe itching. These bugs can only be seen with a microscope. Scabies are highly contagious. They can spread easily from person to person by direct contact. They are also spread through sharing clothing or linens that have the scabies mites living in them. It is not unusual for an entire family to become infected through shared towels, clothing, or bedding.  HOME CARE INSTRUCTIONS   Your caregiver may prescribe a cream or lotion to kill the mites. If this cream is prescribed; massage the cream into the entire area of the body from the neck to the bottom of both feet. Also massage the cream into the scalp and face if your child is less than 1 year old. Avoid the eyes and mouth.   Leave the cream on for 8 to12 hours. Do not wash your hands after application. Your child should bathe or shower after the 8 to 12 hour application period. Sometimes it is helpful to apply the cream to your child at right before bedtime.   One treatment is usually effective and will eliminate approximately 95% of infestations. For severe cases, your caregiver may decide to repeat the treatment in 1 week. Everyone in your household should be treated with one application of the cream.   New rashes or burrows should not appear after successful treatment within 24 to 48 hours; however the itching and rash may last for 2 to 4 weeks after successful treatment. If your symptoms persist longer than this, see your caregiver.   Your caregiver also may prescribe a medication to help with the itching or to help the rash go away more quickly.   Scabies can live on clothing or linens for up to 3 days. Your entire child's recently used clothing, towels, stuffed toys, and bed linens should be washed in hot water and then dried in a dryer for at least 20 minutes on high heat. Items that cannot be washed should be enclosed in a plastic bag for at least 3  days.   To help relieve itching, bathe your child in a cool bath or apply cool washcloths to the affected areas.   Your child may return to school after treatment with the prescribed cream.  SEEK MEDICAL CARE IF:   The itching persists longer than 4 weeks after treatment.   The rash spreads or becomes infected (the area has red blisters or yellow-tan crust).  Document Released: 11/28/2005 Document Revised: 11/17/2011 Document Reviewed: 04/08/2009 [employer] Patient Information 2012 [employer]. 

## 2012-05-26 NOTE — ED Notes (Signed)
Pt states she was seen here on Tuesday and diagnosed with shingles, but now rash is spreading and pain is worse. Has been taking meds as instructed.

## 2012-05-27 NOTE — ED Provider Notes (Signed)
Medical screening examination/treatment/procedure(s) were performed by non-physician practitioner and as supervising physician I was immediately available for consultation/collaboration.   Nat Christen, MD 05/27/12 [phone]

## 2012-06-11 ENCOUNTER — Encounter (HOSPITAL_BASED_OUTPATIENT_CLINIC_OR_DEPARTMENT_OTHER): Payer: Self-pay | Admitting: *Deleted

## 2012-06-11 ENCOUNTER — Emergency Department (HOSPITAL_BASED_OUTPATIENT_CLINIC_OR_DEPARTMENT_OTHER)
Admission: EM | Admit: 2012-06-11 | Discharge: 2012-06-11 | Disposition: A | Discharge status: Home / Self Care | Payer: Self-pay | Attending: Emergency Medicine | Admitting: Emergency Medicine

## 2012-06-11 DIAGNOSIS — F17200 Nicotine dependence, unspecified, uncomplicated: Secondary | ICD-10-CM | POA: Insufficient documentation

## 2012-06-11 DIAGNOSIS — IMO0002 Reserved for concepts with insufficient information to code with codable children: Secondary | ICD-10-CM | POA: Insufficient documentation

## 2012-06-11 DIAGNOSIS — R109 Unspecified abdominal pain: Secondary | ICD-10-CM | POA: Insufficient documentation

## 2012-06-11 LAB — URINALYSIS, ROUTINE W REFLEX MICROSCOPIC
Bilirubin Urine: NEGATIVE
Nitrite: NEGATIVE
Specific Gravity, Urine: 1.012 (ref 1.005–1.030)
Urobilinogen, UA: 0.2 mg/dL (ref 0.0–1.0)

## 2012-06-11 LAB — COMPREHENSIVE METABOLIC PANEL
ALT: 9 U/L (ref 0–35)
AST: 17 U/L (ref 0–37)
CO2: 28 mEq/L (ref 19–32)
Calcium: 9.2 mg/dL (ref 8.4–10.5)
GFR calc non Af Amer: >90 mL/min (ref >90)
Sodium: 135 mEq/L (ref 135–145)
Total Protein: 7.5 g/dL (ref 6.0–8.3)

## 2012-06-11 LAB — CBC
MCH: 30.9 pg (ref 26.0–34.0)
Platelets: 424 10*3/uL — ABNORMAL HIGH (ref 150–400)
RBC: 3.85 MIL/uL — ABNORMAL LOW (ref 3.87–5.11)

## 2012-06-11 MED ORDER — RANITIDINE HCL 150 MG PO TABS: 150.0000 mg | ORAL_TABLET | Freq: Two times a day (BID) | ORAL | Status: DC

## 2012-06-11 MED ORDER — HYDROMORPHONE HCL PF 1 MG/ML IJ SOLN
1.0000 mg | Freq: Once | INTRAMUSCULAR | Status: AC
  Administered 2012-06-11: 1 mg via INTRAVENOUS
  Filled 2012-06-11: qty 1

## 2012-06-11 MED ORDER — ONDANSETRON HCL 4 MG/2ML IJ SOLN
4.0000 mg | Freq: Once | INTRAMUSCULAR | Status: AC
  Administered 2012-06-11: 4 mg via INTRAVENOUS
  Filled 2012-06-11: qty 2

## 2012-06-11 NOTE — Discharge Instructions (Signed)
Return to the ED with any concerns including fever/chills, vomiting, decreased level of alertness/lethargy, or any other alarming symptoms

## 2012-06-11 NOTE — ED Provider Notes (Signed)
History   This chart was scribed for Mallory Chick, MD by Shari Heritage. The patient was seen in room MH11/MH11. Patient's care was started at 1906.     CSN: [id number]  Arrival date & time 06/11/12  1906   First MD Initiated Contact with Patient 06/11/12 1917      Chief Complaint  Patient presents with  . Abdominal Pain    (Consider location/radiation/quality/duration/timing/severity/associated sxs/prior treatment) Patient is a 46 y.o. female presenting with abdominal pain. The history is provided by the patient. No language interpreter was used.  Abdominal Pain The primary symptoms of the illness include abdominal pain, nausea and vomiting. The current episode started more than 2 days ago. The onset of the illness was sudden. The problem has not changed since onset. The abdominal pain began more than 2 days ago. The pain came on suddenly. The abdominal pain has been unchanged since its onset. The abdominal pain is located in the epigastric region and RUQ. The abdominal pain does not radiate. The severity of the abdominal pain is 10/10. The abdominal pain is relieved by nothing. The abdominal pain is exacerbated by vomiting.  Nausea began 3 to 5 days ago. The nausea is associated with eating. The nausea is exacerbated by food.  The vomiting began today. Vomiting occurs 2 to 5 times per day. The emesis contains stomach contents.  The patient states that she believes she is currently not pregnant. The patient has not had a change in bowel habit. Significant associated medical issues do not include gallstones.   Esteen CAITLYNE Shaw is a 46 y.o. female who presents to the Emergency Department complaining of RUQ and epigastric, persistent, moderate to severe abdominal pain onset 4 days ago. Associated symptoms include nausea and vomiting. Patient says that she was seen at [facility] last Saturday and had an ultrasound performed. Patient was diagnosed with a left kidney stone. Patient with medical h/o  substance abuse, uterine cancer and degenerative disk disease. Also with surgical h/o partial hysterectomy. Patient is a current smoker (1 pack/day for 20 years). Patient has no PCP.  Past Medical History  Diagnosis Date  . Substance abuse   . Uterine cancer   . Degenerative disk disease     Past Surgical History  Procedure Date  . Partial hysterectomy     No family history on file.  History  Substance Use Topics  . Smoking status: Current Everyday Smoker -- 1.0 packs/day for 20 years  . Smokeless tobacco: Not on file  . Alcohol Use: Yes    OB History    Grav Para Term Preterm Abortions TAB SAB Ect Mult Living                  Review of Systems  Gastrointestinal: Positive for nausea, vomiting and abdominal pain.  All other systems reviewed and are negative.    Allergies  Review of patient's allergies indicates no known allergies.  Home Medications   Current Outpatient Rx  Name Route Sig Dispense Refill  . VITAMIN C PO Oral Take 1 tablet by mouth daily.    Marland Kitchen VITAMIN B-12 PO Oral Take 1 tablet by mouth daily.    Marland Kitchen FAMOTIDINE 20 MG PO TABS Oral Take 20 mg by mouth daily as needed. For acid reflux    . FLUOXETINE HCL 40 MG PO CAPS Oral Take 40 mg by mouth daily.    Marland Kitchen GARLIC PO Oral Take 1 tablet by mouth daily.    Marland Kitchen GREEN TEA PO Oral  Take 1 tablet by mouth daily.    . IBUPROFEN 200 MG PO TABS Oral Take 600-800 mg by mouth 3 (three) times daily as needed. For pain    . LIDOCAINE 4 % EX CREA Topical Apply topically as needed. 30 g 0  . ADULT MULTIVITAMIN W/MINERALS CH Oral Take 1 tablet by mouth daily.    Marland Kitchen FISH OIL PO Oral Take 1 capsule by mouth daily.    Marland Kitchen OVER THE COUNTER MEDICATION Oral Take 1 tablet by mouth daily. Stress vitamin    . TRAZODONE HCL 100 MG PO TABS Oral Take 100 mg by mouth at bedtime as needed. For sleep    . VITAMIN D (CHOLECALCIFEROL) PO Oral Take 1 tablet by mouth daily.    Marland Kitchen RANITIDINE HCL 150 MG PO TABS Oral Take 1 tablet (150 mg total) by  mouth 2 (two) times daily. 60 tablet 0    BP 131/79  Pulse 81  Temp 98.1 F (36.7 C) (Oral)  Resp 20  Ht 5' (1.524 m)  Wt 115 lb (52.164 kg)  BMI 22.46 kg/m2  SpO2 100%  Physical Exam  Nursing note and vitals reviewed. Constitutional: She is oriented to person, place, and time. She appears well-developed and well-nourished.       Patient is uncomfortable, anxious and tearful.  HENT:  Head: Normocephalic and atraumatic.  Eyes: Conjunctivae and EOM are normal. Pupils are equal, round, and reactive to light.  Neck: Normal range of motion. Neck supple.  Cardiovascular: Normal rate and regular rhythm.   Pulmonary/Chest: Effort normal and breath sounds normal.  Abdominal: Soft. Bowel sounds are normal. There is tenderness. There is guarding. There is no rebound.       Moderate tenderness in epigastrium and RUQ with some guarding and no rebound.   Musculoskeletal: Normal range of motion.  Neurological: She is alert and oriented to person, place, and time.  Skin: Skin is warm and dry.  Psychiatric: She has a normal mood and affect.    ED Course  Procedures (including critical care time) DIAGNOSTIC STUDIES: Oxygen Saturation is 98% on room air, normal by my interpretation.    COORDINATION OF CARE: 7:37PM- Patient informed of current plan for treatment and evaluation and agrees with plan at this time. Will attempt to get [place] result from [facility]. If result is unavailable, will order a repeat US.  Results for orders placed during the hospital encounter of 06/11/12  URINALYSIS, ROUTINE W REFLEX MICROSCOPIC      Component Value Range   Color, Urine YELLOW  YELLOW   APPearance CLOUDY (*) CLEAR   Specific Gravity, Urine 1.012  1.005 - 1.030   pH 7.0  5.0 - 8.0   Glucose, UA NEGATIVE  NEGATIVE mg/dL   Hgb urine dipstick NEGATIVE  NEGATIVE   Bilirubin Urine NEGATIVE  NEGATIVE   Ketones, ur NEGATIVE  NEGATIVE mg/dL   Protein, ur NEGATIVE  NEGATIVE mg/dL   Urobilinogen, UA 0.2  0.0 - 1.0  mg/dL   Nitrite NEGATIVE  NEGATIVE   Leukocytes, UA NEGATIVE  NEGATIVE  CBC      Component Value Range   WBC 10.4  4.0 - 10.5 K/uL   RBC 3.85 (*) 3.87 - 5.11 MIL/uL   Hemoglobin 11.9 (*) 12.0 - 15.0 g/dL   HCT 16.1 (*) 09.6 - 04.5 %   MCV 89.9  78.0 - 100.0 fL   MCH 30.9  26.0 - 34.0 pg   MCHC 34.4  30.0 - 36.0 g/dL   RDW  12.3  11.5 - 15.5 %   Platelets 424 (*) 150 - 400 K/uL  COMPREHENSIVE METABOLIC PANEL      Component Value Range   Sodium 135  135 - 145 mEq/L   Potassium 4.5  3.5 - 5.1 mEq/L   Chloride 98  96 - 112 mEq/L   CO2 28  19 - 32 mEq/L   Glucose, Bld 97  70 - 99 mg/dL   BUN 15  6 - 23 mg/dL   Creatinine, Ser 1.61  0.50 - 1.10 mg/dL   Calcium 9.2  8.4 - 09.6 mg/dL   Total Protein 7.5  6.0 - 8.3 g/dL   Albumin 4.1  3.5 - 5.2 g/dL   AST 17  0 - 37 U/L   ALT 9  0 - 35 U/L   Alkaline Phosphatase 56  39 - 117 U/L   Total Bilirubin 0.3  0.3 - 1.2 mg/dL   GFR calc non Af Amer >90  >90 mL/min   GFR calc Af Amer >90  >90 mL/min  LIPASE, BLOOD      Component Value Range   Lipase 18  11 - 59 U/L    No results found.   1. Abdominal pain       MDM  Pt presents with epigastric and right upper quadrant pain.  Labs reassuring. I have obtained ultrasound and CT scan results from 2 days ago performed at [facility].  They were both normal with the exception of a small left sided renal stone- which is not likely the cause of any of her pain at this time.  Discussed all results with patient.  PT discharged with stirct return precautiosn, she is agreeable with this plan.        I personally performed the services described in this documentation, which was scribed in my presence. The recorded information has been reviewed and considered.    Mallory Chick, MD 06/12/12 [phone]

## 2012-06-11 NOTE — ED Notes (Signed)
Pt c/o RUQ/epigastric pains since last Thursday. Pt saw [facility] last Saturday and had an ultrasound performed and was told she had a left kidney stone. Pt reports worsening pain with nausea and vomiting. Pain worsens after eating. Unable to hold down food/fluids per pt.

## 2012-06-20 IMAGING — CR DG FOOT COMPLETE 3+V*R*
3 series · 3 of 3 positions shown · non-contrast
Comparison: Right foot x-rays 04/19/2009.

CLINICAL DATA: Fell and injured right foot.

RIGHT FOOT COMPLETE - 3+ VIEW [DATE]:

[t foot ap right]
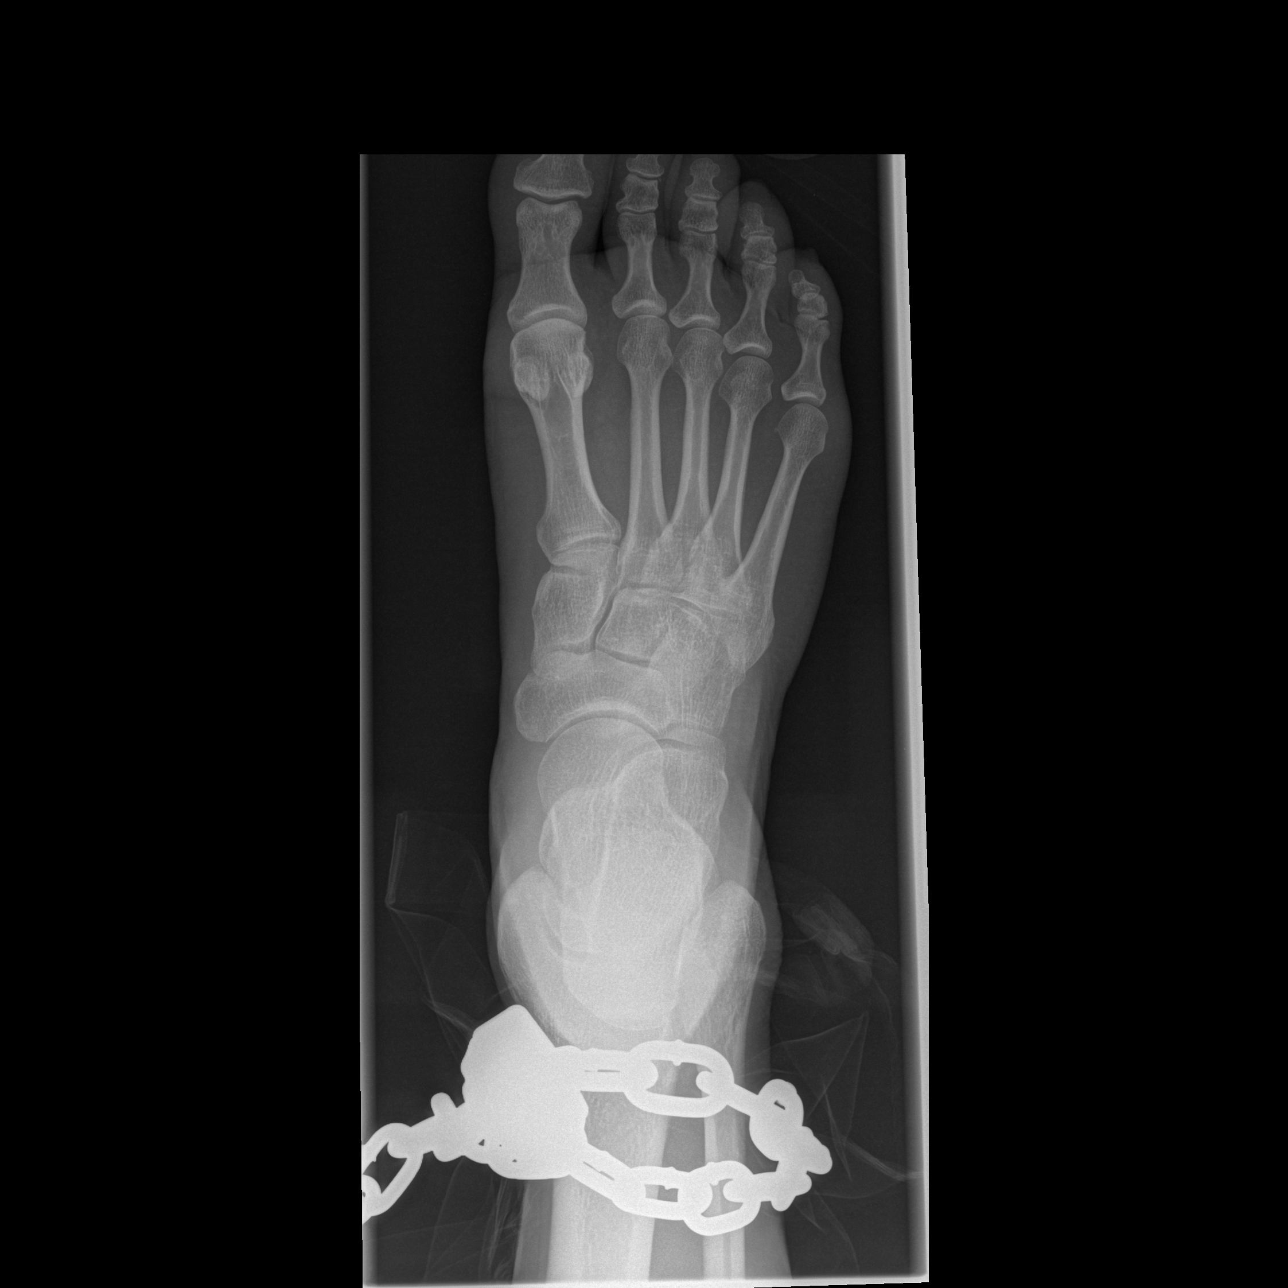

[t foot oblique right]
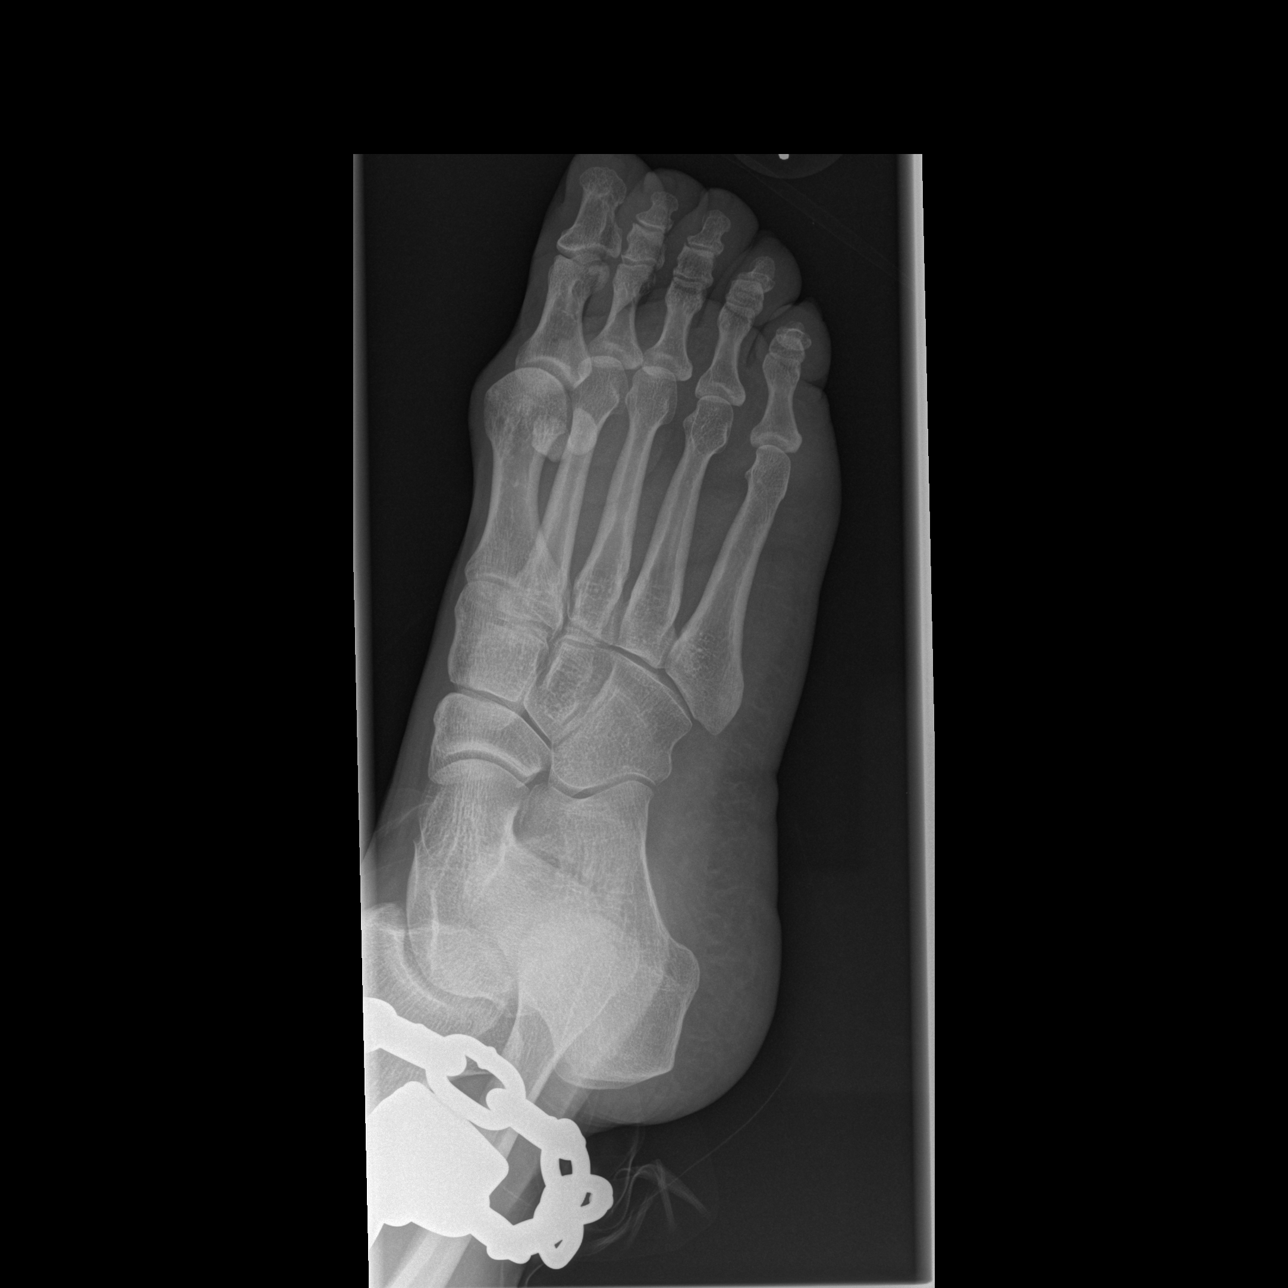

[t foot lat right]
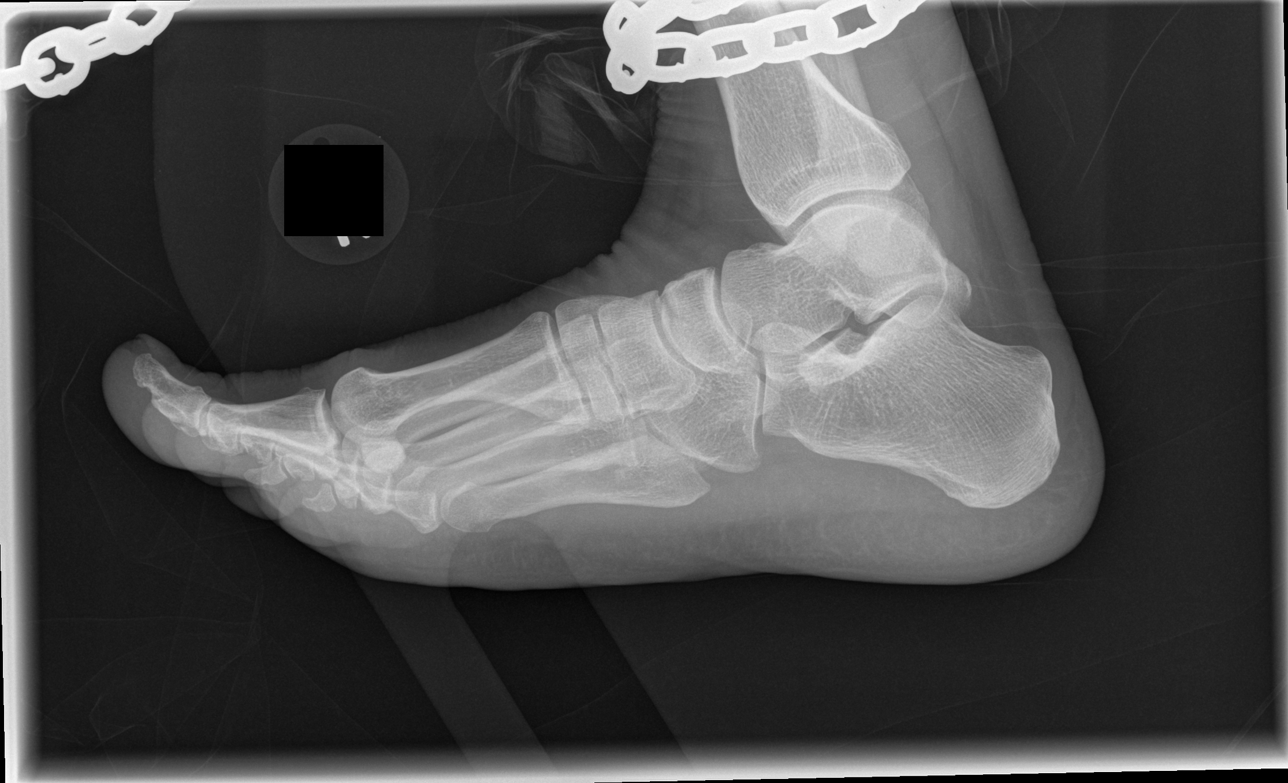

[3 of 3 positions shown; findings below may reference images not displayed]

FINDINGS: No evidence of acute or subacute fracture or dislocation.
Well-preserved joint spaces.  Well-preserved bone mineral density.
No intrinsic osseous abnormalities. No significant interval change.
IMPRESSION: Normal examination.

## 2012-11-18 ENCOUNTER — Encounter (HOSPITAL_COMMUNITY): Payer: Self-pay | Admitting: *Deleted

## 2012-11-18 ENCOUNTER — Emergency Department (HOSPITAL_COMMUNITY)
Admission: EM | Admit: 2012-11-18 | Discharge: 2012-11-18 | Disposition: A | Discharge status: Home / Self Care | Payer: Self-pay | Attending: Emergency Medicine | Admitting: Emergency Medicine

## 2012-11-18 DIAGNOSIS — Z8542 Personal history of malignant neoplasm of other parts of uterus: Secondary | ICD-10-CM | POA: Insufficient documentation

## 2012-11-18 DIAGNOSIS — F17200 Nicotine dependence, unspecified, uncomplicated: Secondary | ICD-10-CM | POA: Insufficient documentation

## 2012-11-18 DIAGNOSIS — W010XXA Fall on same level from slipping, tripping and stumbling without subsequent striking against object, initial encounter: Secondary | ICD-10-CM | POA: Insufficient documentation

## 2012-11-18 DIAGNOSIS — F1910 Other psychoactive substance abuse, uncomplicated: Secondary | ICD-10-CM | POA: Insufficient documentation

## 2012-11-18 DIAGNOSIS — Y9301 Activity, walking, marching and hiking: Secondary | ICD-10-CM | POA: Insufficient documentation

## 2012-11-18 DIAGNOSIS — M545 Low back pain, unspecified: Secondary | ICD-10-CM | POA: Insufficient documentation

## 2012-11-18 DIAGNOSIS — G8929 Other chronic pain: Secondary | ICD-10-CM | POA: Insufficient documentation

## 2012-11-18 DIAGNOSIS — Y92009 Unspecified place in unspecified non-institutional (private) residence as the place of occurrence of the external cause: Secondary | ICD-10-CM | POA: Insufficient documentation

## 2012-11-18 DIAGNOSIS — IMO0002 Reserved for concepts with insufficient information to code with codable children: Secondary | ICD-10-CM | POA: Insufficient documentation

## 2012-11-18 DIAGNOSIS — Z79899 Other long term (current) drug therapy: Secondary | ICD-10-CM | POA: Insufficient documentation

## 2012-11-18 MED ORDER — IBUPROFEN 800 MG PO TABS: 800.0000 mg | ORAL_TABLET | Freq: Three times a day (TID) | ORAL | Status: DC

## 2012-11-18 MED ORDER — DIAZEPAM 5 MG PO TABS: 5.0000 mg | ORAL_TABLET | Freq: Three times a day (TID) | ORAL | Status: DC | PRN

## 2012-11-18 MED ORDER — HYDROCODONE-ACETAMINOPHEN 5-500 MG PO TABS: 1.0000 | ORAL_TABLET | Freq: Four times a day (QID) | ORAL | Status: DC | PRN

## 2012-11-18 MED ORDER — KETOROLAC TROMETHAMINE 60 MG/2ML IM SOLN
60.0000 mg | Freq: Once | INTRAMUSCULAR | Status: AC
  Administered 2012-11-18: 60 mg via INTRAMUSCULAR
  Filled 2012-11-18: qty 2

## 2012-11-18 MED ORDER — HYDROMORPHONE HCL PF 1 MG/ML IJ SOLN
1.0000 mg | Freq: Once | INTRAMUSCULAR | Status: AC
  Administered 2012-11-18: 1 mg via INTRAMUSCULAR
  Filled 2012-11-18: qty 1

## 2012-11-18 MED ORDER — DIAZEPAM 5 MG PO TABS
5.0000 mg | ORAL_TABLET | Freq: Once | ORAL | Status: AC
  Administered 2012-11-18: 5 mg via ORAL
  Filled 2012-11-18: qty 1

## 2012-11-18 NOTE — ED Notes (Signed)
Pt from home with c/o back pain- patient slipped earlier and felt her back pull. Since then she has had back spasms with tingling in her right leg. Denies falling or trauma. Took 800mg  ibuprofen this afternoon for pain relief with no relief. No relief with heating pad either.

## 2012-11-18 NOTE — ED Provider Notes (Signed)
History     CSN: [id number]  Arrival date & time 11/18/12  1722   First MD Initiated Contact with Patient 11/18/12 1806      Chief Complaint  Patient presents with  . Back Pain    (Consider location/radiation/quality/duration/timing/severity/associated sxs/prior treatment) HPI Mallory Shaw is a 46 y.o. female who presents with complaint of back pain. Pt states she has chronic back issues, herniated disk in lower back, was told she will need surgery. States she was walking down steps today when she missed the last step, and slipped down, injuring her lower back. States  Same pain as before, just more severe. States tingling in right thigh, which she has had before. Denies numbness or weakness in the leg or foot. No loss of bowels or urinary incontinence or retention. She did not fall all the way to the ground. States took, ibuprofen and tried heating pad with no improvement. This happened today. Pain is worsened with any movement and certain positions.    Past Medical History  Diagnosis Date  . Substance abuse   . Uterine cancer   . Degenerative disk disease     Past Surgical History  Procedure Date  . Partial hysterectomy     No family history on file.  History  Substance Use Topics  . Smoking status: Current Every Day Smoker -- 1.0 packs/day for 20 years  . Smokeless tobacco: Not on file  . Alcohol Use: Yes    OB History    Grav Para Term Preterm Abortions TAB SAB Ect Mult Living                  Review of Systems  Constitutional: Negative for fever and chills.  HENT: Negative for neck pain and neck stiffness.   Respiratory: Negative.   Cardiovascular: Negative.   Gastrointestinal: Negative.   Genitourinary: Negative for dysuria and flank pain.  Musculoskeletal: Positive for back pain.  Skin: Negative.   Neurological: Negative for weakness and numbness.    Allergies  Review of patient's allergies indicates no known allergies.  Home Medications   Current  Outpatient Rx  Name  Route  Sig  Dispense  Refill  . VITAMIN C PO   Oral   Take 1 tablet by mouth daily.         Marland Kitchen VITAMIN B-12 PO   Oral   Take 1 tablet by mouth daily.         Marland Kitchen GREEN TEA PO   Oral   Take 1 tablet by mouth daily.         . IBUPROFEN 200 MG PO TABS   Oral   Take 600-800 mg by mouth 3 (three) times daily as needed. For pain         . LIDOCAINE 4 % EX CREA   Topical   Apply topically as needed.   30 g   0   . ADULT MULTIVITAMIN W/MINERALS CH   Oral   Take 1 tablet by mouth daily.         Marland Kitchen FISH OIL PO   Oral   Take 1 capsule by mouth daily.         Marland Kitchen OVER THE COUNTER MEDICATION   Oral   Take 1 tablet by mouth daily. Stress vitamin         . RANITIDINE HCL 150 MG PO TABS   Oral   Take 1 tablet (150 mg total) by mouth 2 (two) times daily.   60 tablet  0   . TRAZODONE HCL 100 MG PO TABS   Oral   Take 100 mg by mouth at bedtime as needed. For sleep         . VITAMIN D (CHOLECALCIFEROL) PO   Oral   Take 1 tablet by mouth daily.           BP 119/81  Pulse 85  Temp 98.5 F (36.9 C) (Oral)  Resp 18  Ht 4\' 11"  (1.499 m)  Wt 110 lb (49.896 kg)  BMI 22.22 kg/m2  SpO2 100%  Physical Exam  Nursing note and vitals reviewed. Constitutional: She is oriented to person, place, and time. She appears well-developed and well-nourished. No distress.  Eyes: Conjunctivae normal are normal.  Cardiovascular: Normal rate, regular rhythm and normal heart sounds.   Pulmonary/Chest: Effort normal and breath sounds normal. No respiratory distress. She has no wheezes. She has no rales.  Abdominal: Soft. Bowel sounds are normal. She exhibits no distension. There is no tenderness. There is no rebound.  Musculoskeletal:       Tenderness over midline lower back and right SI joint. Pain with right straight leg raise. 5/5 and equal UE and LE strength. Normal sensation in all dermatomes of LE, normal sensation of perineum. 2+ and equal patellar  reflexes bilaterally  Neurological: She is alert and oriented to person, place, and time.  Skin: Skin is warm and dry.    ED Course  Procedures (including critical care time)    1. Exacerbation of chronic back pain       MDM  Pt with exacerbation of her chronic back pain. Pt has no neuro deficits. She has no red flags suggesting cauda equina. Her VS are normal. She is afebrile. She did not fall to the ground, doubt fracture or subluxation. She had no improvement with ibuprofen at home.        Lottie Mussel, PA 11/18/12 2342

## 2012-11-21 NOTE — ED Provider Notes (Signed)
Medical screening examination/treatment/procedure(s) were performed by non-physician practitioner and as supervising physician I was immediately available for consultation/collaboration.   Richardean Canal, MD 11/21/12 1105

## 2012-12-08 ENCOUNTER — Emergency Department (HOSPITAL_COMMUNITY)
Admission: EM | Admit: 2012-12-08 | Discharge: 2012-12-08 | Disposition: A | Discharge status: Home / Self Care | Payer: Self-pay | Attending: Emergency Medicine | Admitting: Emergency Medicine

## 2012-12-08 ENCOUNTER — Encounter (HOSPITAL_COMMUNITY): Payer: Self-pay | Admitting: Emergency Medicine

## 2012-12-08 DIAGNOSIS — J029 Acute pharyngitis, unspecified: Secondary | ICD-10-CM | POA: Insufficient documentation

## 2012-12-08 DIAGNOSIS — F1910 Other psychoactive substance abuse, uncomplicated: Secondary | ICD-10-CM | POA: Insufficient documentation

## 2012-12-08 DIAGNOSIS — J3489 Other specified disorders of nose and nasal sinuses: Secondary | ICD-10-CM | POA: Insufficient documentation

## 2012-12-08 DIAGNOSIS — R51 Headache: Secondary | ICD-10-CM | POA: Insufficient documentation

## 2012-12-08 DIAGNOSIS — Z8739 Personal history of other diseases of the musculoskeletal system and connective tissue: Secondary | ICD-10-CM | POA: Insufficient documentation

## 2012-12-08 DIAGNOSIS — Z79899 Other long term (current) drug therapy: Secondary | ICD-10-CM | POA: Insufficient documentation

## 2012-12-08 DIAGNOSIS — Z8544 Personal history of malignant neoplasm of other female genital organs: Secondary | ICD-10-CM | POA: Insufficient documentation

## 2012-12-08 DIAGNOSIS — J111 Influenza due to unidentified influenza virus with other respiratory manifestations: Secondary | ICD-10-CM | POA: Insufficient documentation

## 2012-12-08 DIAGNOSIS — J40 Bronchitis, not specified as acute or chronic: Secondary | ICD-10-CM | POA: Insufficient documentation

## 2012-12-08 DIAGNOSIS — F17200 Nicotine dependence, unspecified, uncomplicated: Secondary | ICD-10-CM | POA: Insufficient documentation

## 2012-12-08 MED ORDER — AEROCHAMBER Z-STAT PLUS/MEDIUM MISC
1.0000 | Freq: Once | Status: AC
  Administered 2012-12-08: 1

## 2012-12-08 MED ORDER — OSELTAMIVIR PHOSPHATE 75 MG PO CAPS: 75.0000 mg | ORAL_CAPSULE | Freq: Two times a day (BID) | ORAL | Status: DC

## 2012-12-08 MED ORDER — ALBUTEROL SULFATE HFA 108 (90 BASE) MCG/ACT IN AERS
2.0000 | INHALATION_SPRAY | RESPIRATORY_TRACT | Status: DC | PRN
  Administered 2012-12-08: 2 via RESPIRATORY_TRACT
  Filled 2012-12-08: qty 6.7

## 2012-12-08 MED ORDER — HYDROCOD POLST-CHLORPHEN POLST 10-8 MG/5ML PO LQCR: 5.0000 mL | Freq: Two times a day (BID) | ORAL | Status: DC

## 2012-12-08 NOTE — ED Notes (Signed)
Pt states she is a smoker, worried about her cough and congestion she's had since christmas eve, states she has a hx of bronchitis. Pt states she also has a sore throat.

## 2012-12-08 NOTE — ED Notes (Signed)
Pt states that she has been having sore throat, cough, nasal congestion since christmas eve.  Pain 7/10.

## 2012-12-08 NOTE — ED Provider Notes (Signed)
History     CSN: [id number]  Arrival date & time 12/08/12  1301   First MD Initiated Contact with Patient 12/08/12 [phone]      Chief Complaint  Patient presents with  . Sore Throat  . Cough  . Headache  . Nasal Congestion    (Consider location/radiation/quality/duration/timing/severity/associated sxs/prior treatment) HPI Comments: Mallory Shaw is a 46 y.o. Female who is ill with nausea, vomiting, and diarrhea, last week; without any vomiting, in 3 days. She has also had cough, and nasal congestion for 2 days with fever. She has ongoing, chronic cough, productive of dark mucus. Has been no hemoptysis. She denies chest pain, shortness of breath, weakness, or dizziness. There no other modifying factors.  Patient is a 46 y.o. female presenting with pharyngitis, cough, and headaches. The history is provided by the patient.  Sore Throat Associated symptoms include headaches.  Cough Associated symptoms include headaches.  Headache     Past Medical History  Diagnosis Date  . Substance abuse   . Uterine cancer   . Degenerative disk disease     Past Surgical History  Procedure Date  . Partial hysterectomy     History reviewed. No pertinent family history.  History  Substance Use Topics  . Smoking status: Current Every Day Smoker -- 1.0 packs/day for 20 years  . Smokeless tobacco: Not on file  . Alcohol Use: Yes    OB History    Grav Para Term Preterm Abortions TAB SAB Ect Mult Living                  Review of Systems  Respiratory: Positive for cough.   Neurological: Positive for headaches.  All other systems reviewed and are negative.    Allergies  Review of patient's allergies indicates no known allergies.  Home Medications   Current Outpatient Rx  Name  Route  Sig  Dispense  Refill  . VITAMIN D 1000 UNITS PO TABS   Oral   Take 1,000 Units by mouth daily.         . OMEGA-3 FATTY ACIDS 1000 MG PO CAPS   Oral   Take 2 g by mouth daily.         Marland Kitchen  GREEN TEA PO   Oral   Take 1 tablet by mouth daily.         . IBUPROFEN 200 MG PO TABS   Oral   Take 600-800 mg by mouth daily. For pain         . ADULT MULTIVITAMIN W/MINERALS CH   Oral   Take 1 tablet by mouth daily.         Marland Kitchen VITAMIN B-12 1000 MCG PO TABS   Oral   Take 2,000 mcg by mouth daily.         Marland Kitchen VITAMIN C 500 MG PO TABS   Oral   Take 1,500 mg by mouth daily.         Marland Kitchen HYDROCOD POLST-CPM POLST ER 10-8 MG/5ML PO LQCR   Oral   Take 5 mLs by mouth every 12 (twelve) hours.   115 mL   0   . DIAZEPAM 5 MG PO TABS   Oral   Take 1 tablet (5 mg total) by mouth every 8 (eight) hours as needed for anxiety.   15 tablet   0   . HYDROCODONE-ACETAMINOPHEN 5-500 MG PO TABS   Oral   Take 1-2 tablets by mouth every 6 (six) hours as needed for  pain.   15 tablet   0   . IBUPROFEN 800 MG PO TABS   Oral   Take 1 tablet (800 mg total) by mouth 3 (three) times daily.   21 tablet   0   . OSELTAMIVIR PHOSPHATE 75 MG PO CAPS   Oral   Take 1 capsule (75 mg total) by mouth every 12 (twelve) hours.   10 capsule   0     BP 119/74  Pulse 91  Temp 97.9 F (36.6 C) (Oral)  Resp 20  Ht 4' 11.75" (1.518 m)  Wt 107 lb (48.535 kg)  BMI 21.07 kg/m2  SpO2 100%  Physical Exam  Nursing note and vitals reviewed. Constitutional: She is oriented to person, place, and time. She appears well-developed and well-nourished.  HENT:  Head: Normocephalic and atraumatic.  Eyes: Conjunctivae normal and EOM are normal. Pupils are equal, round, and reactive to light.  Neck: Normal range of motion and phonation normal. Neck supple.  Cardiovascular: Normal rate, regular rhythm and intact distal pulses.   Pulmonary/Chest: Effort normal and breath sounds normal. She has no wheezes. She has no rales. She exhibits no tenderness.       Mildly decreased air movement bilaterally, no Rales.  Abdominal: Soft. She exhibits no distension. There is no tenderness. There is no guarding.    Musculoskeletal: Normal range of motion.  Neurological: She is alert and oriented to person, place, and time. She has normal strength. She exhibits normal muscle tone.  Skin: Skin is warm and dry.  Psychiatric: She has a normal mood and affect. Her behavior is normal. Judgment and thought content normal.    ED Course  Procedures (including critical care time)   Treatment: Albuterol inhaler with AeroChamber  Nursing notes, applicable records and vitals reviewed.  Radiologic Images/Reports reviewed.    1. Influenza   2. Bronchitis       MDM  Signs, and symptoms of influenza, in non-vaccinated patient. Doubt pneumonia, persistent gastroenteritis, or metabolic instability. She is NOT a high risk for influenza complication. She is stable for discharge     Plan: Home Medications-albuterol inhaler, Tamiflu; Home Treatments- rest, fluids; Recommended follow up- PCP when necessary    Flint Melter, MD 12/08/12 2356

## 2013-01-05 ENCOUNTER — Encounter (HOSPITAL_COMMUNITY): Payer: Self-pay | Admitting: Emergency Medicine

## 2013-01-05 ENCOUNTER — Emergency Department (HOSPITAL_COMMUNITY)
Admission: EM | Admit: 2013-01-05 | Discharge: 2013-01-05 | Disposition: A | Discharge status: Home / Self Care | Payer: Self-pay | Attending: Emergency Medicine | Admitting: Emergency Medicine

## 2013-01-05 DIAGNOSIS — Z79899 Other long term (current) drug therapy: Secondary | ICD-10-CM | POA: Insufficient documentation

## 2013-01-05 DIAGNOSIS — Z8542 Personal history of malignant neoplasm of other parts of uterus: Secondary | ICD-10-CM | POA: Insufficient documentation

## 2013-01-05 DIAGNOSIS — M5430 Sciatica, unspecified side: Secondary | ICD-10-CM | POA: Insufficient documentation

## 2013-01-05 DIAGNOSIS — Z8739 Personal history of other diseases of the musculoskeletal system and connective tissue: Secondary | ICD-10-CM | POA: Insufficient documentation

## 2013-01-05 DIAGNOSIS — F17200 Nicotine dependence, unspecified, uncomplicated: Secondary | ICD-10-CM | POA: Insufficient documentation

## 2013-01-05 DIAGNOSIS — M549 Dorsalgia, unspecified: Secondary | ICD-10-CM | POA: Insufficient documentation

## 2013-01-05 MED ORDER — KETOROLAC TROMETHAMINE 60 MG/2ML IM SOLN
60.0000 mg | Freq: Once | INTRAMUSCULAR | Status: AC
  Administered 2013-01-05: 60 mg via INTRAMUSCULAR
  Filled 2013-01-05: qty 2

## 2013-01-05 MED ORDER — CYCLOBENZAPRINE HCL 10 MG PO TABS: 10.0000 mg | ORAL_TABLET | Freq: Two times a day (BID) | ORAL | Status: DC | PRN

## 2013-01-05 MED ORDER — OXYCODONE-ACETAMINOPHEN 5-325 MG PO TABS
2.0000 | ORAL_TABLET | Freq: Once | ORAL | Status: AC
  Administered 2013-01-05: 2 via ORAL
  Filled 2013-01-05: qty 2

## 2013-01-05 MED ORDER — OXYCODONE-ACETAMINOPHEN 5-325 MG PO TABS: 1.0000 | ORAL_TABLET | ORAL | Status: DC | PRN

## 2013-01-05 NOTE — ED Notes (Signed)
Pt presents to ED with back pain for 2 nights.

## 2013-01-05 NOTE — ED Provider Notes (Signed)
History     CSN: [id number]  Arrival date & time 01/05/13  4098   First MD Initiated Contact with Patient 01/05/13 [phone]      Chief Complaint  Patient presents with  . Back Pain    (Consider location/radiation/quality/duration/timing/severity/associated sxs/prior treatment) Patient is a 47 y.o. female presenting with back pain. The history is provided by the patient. No language interpreter was used.  Back Pain  This is a recurrent problem. The current episode started 2 days ago. The problem occurs constantly. The problem has been gradually worsening. The pain is associated with lifting heavy objects. The pain radiates to the right thigh. The pain is at a severity of 10/10. The pain is moderate. The symptoms are aggravated by bending and twisting. The pain is the same all the time. Associated symptoms include leg pain. Pertinent negatives include no chest pain, no fever, no headaches, no bowel incontinence, no perianal numbness, no bladder incontinence, no dysuria, no paresthesias, no paresis and no weakness. She has tried NSAIDs for the symptoms. The treatment provided mild relief.   47 year old female coming in with complaint of right lower back pain that radiates into her right thigh. Denies bowel or bladder incontinence. Denies weakness or numbness to the lower extremities. States that she has a known history of degenerative disc disease. She has an appointment on March 3 with [facility] orthopedics. Patient was here on 11/18/2012 for the same pain. She has no PCP presently. Past medical history of a opiates and heroin abuse.  No fever. No IV drug abuse. Past medical history of substance abuse, uterine cancer, stent generated disc disease partial hysterectomy. Patient is a smoker  Past Medical History  Diagnosis Date  . Substance abuse   . Uterine cancer   . Degenerative disk disease     Past Surgical History  Procedure Date  . Partial hysterectomy     History reviewed. No  pertinent family history.  History  Substance Use Topics  . Smoking status: Current Every Day Smoker -- 1.0 packs/day for 20 years  . Smokeless tobacco: Not on file  . Alcohol Use: Yes    OB History    Grav Para Term Preterm Abortions TAB SAB Ect Mult Living                  Review of Systems  Constitutional: Negative.  Negative for fever.  HENT: Negative.   Eyes: Negative.   Respiratory: Negative.  Negative for shortness of breath.   Cardiovascular: Negative.  Negative for chest pain.  Gastrointestinal: Negative.  Negative for nausea, vomiting and bowel incontinence.  Genitourinary: Negative for bladder incontinence, dysuria, frequency, hematuria and difficulty urinating.  Musculoskeletal: Positive for back pain and gait problem.  Neurological: Negative.  Negative for dizziness, weakness, light-headedness, headaches and paresthesias.  Psychiatric/Behavioral: Negative.   All other systems reviewed and are negative.    Allergies  Review of patient's allergies indicates no known allergies.  Home Medications   Current Outpatient Rx  Name  Route  Sig  Dispense  Refill  . VITAMIN D 1000 UNITS PO TABS   Oral   Take 1,000 Units by mouth daily.         . OMEGA-3 FATTY ACIDS 1000 MG PO CAPS   Oral   Take 2 g by mouth daily.         Marland Kitchen GREEN TEA PO   Oral   Take 1 tablet by mouth daily.         . IBUPROFEN  200 MG PO TABS   Oral   Take 600-800 mg by mouth daily. For pain         . ADULT MULTIVITAMIN W/MINERALS CH   Oral   Take 1 tablet by mouth daily.         Marland Kitchen VITAMIN B-12 1000 MCG PO TABS   Oral   Take 2,000 mcg by mouth 2 (two) times daily.          Marland Kitchen VITAMIN C 500 MG PO TABS   Oral   Take 1,500 mg by mouth daily.           BP 155/83  Pulse 105  Temp 97.3 F (36.3 C) (Oral)  Resp 18  SpO2 100%  Physical Exam  Nursing note and vitals reviewed. Constitutional: She is oriented to person, place, and time. She appears well-developed and  well-nourished.  HENT:  Head: Normocephalic and atraumatic.  Eyes: Conjunctivae normal and EOM are normal. Pupils are equal, round, and reactive to light.  Neck: Normal range of motion. Neck supple.  Cardiovascular: Normal rate.   Pulmonary/Chest: Effort normal and breath sounds normal. No respiratory distress.  Abdominal: Soft.  Musculoskeletal: Normal range of motion. She exhibits tenderness. She exhibits no edema.       Tenderness to R buttocks and R posterior thigh  Neurological: She is alert and oriented to person, place, and time. She has normal reflexes. She displays normal reflexes. No cranial nerve deficit. Coordination normal.  Skin: Skin is warm and dry.  Psychiatric: She has a normal mood and affect.    ED Course  Procedures (including critical care time)  Labs Reviewed - No data to display No results found.   No diagnosis found.    MDM  Recurring back pain with DDD.  Percocet and toradol in ER with relief.  rx for a few percocet and flexeril.  She will follow up with pcp of choice and keep her appointment with ortho on Feb 3.  No cauda equina symptoms or weakness.  She understands to return for worsening symptoms.        Remi Haggard, NP 01/05/13 [phone]

## 2013-01-05 NOTE — ED Provider Notes (Signed)
Medical screening examination/treatment/procedure(s) were performed by non-physician practitioner and as supervising physician I was immediately available for consultation/collaboration.  Sunnie Nielsen, MD 01/05/13 [phone]

## 2013-03-12 ENCOUNTER — Emergency Department (HOSPITAL_BASED_OUTPATIENT_CLINIC_OR_DEPARTMENT_OTHER)
Admission: EM | Admit: 2013-03-12 | Discharge: 2013-03-12 | Disposition: A | Discharge status: Home / Self Care | Payer: Self-pay | Attending: Emergency Medicine | Admitting: Emergency Medicine

## 2013-03-12 ENCOUNTER — Emergency Department (HOSPITAL_BASED_OUTPATIENT_CLINIC_OR_DEPARTMENT_OTHER): Payer: Self-pay

## 2013-03-12 ENCOUNTER — Encounter (HOSPITAL_BASED_OUTPATIENT_CLINIC_OR_DEPARTMENT_OTHER): Payer: Self-pay | Admitting: *Deleted

## 2013-03-12 DIAGNOSIS — Z79899 Other long term (current) drug therapy: Secondary | ICD-10-CM | POA: Insufficient documentation

## 2013-03-12 DIAGNOSIS — Z8739 Personal history of other diseases of the musculoskeletal system and connective tissue: Secondary | ICD-10-CM | POA: Insufficient documentation

## 2013-03-12 DIAGNOSIS — M25529 Pain in unspecified elbow: Secondary | ICD-10-CM | POA: Insufficient documentation

## 2013-03-12 DIAGNOSIS — F17200 Nicotine dependence, unspecified, uncomplicated: Secondary | ICD-10-CM | POA: Insufficient documentation

## 2013-03-12 DIAGNOSIS — Z8542 Personal history of malignant neoplasm of other parts of uterus: Secondary | ICD-10-CM | POA: Insufficient documentation

## 2013-03-12 DIAGNOSIS — M25521 Pain in right elbow: Secondary | ICD-10-CM

## 2013-03-12 MED ORDER — HYDROCODONE-ACETAMINOPHEN 5-325 MG PO TABS: 1.0000 | ORAL_TABLET | ORAL | Status: DC | PRN

## 2013-03-12 NOTE — ED Notes (Signed)
Pt c/o throbbing pain in right elbow x 2 months. Over last 2 days, pain has increased and pt has weakness when trying to pick things up with right hand.

## 2013-03-12 NOTE — ED Provider Notes (Signed)
History     CSN: [id number]  Arrival date & time 03/12/13  Mallory Shaw   First MD Initiated Contact with Patient 03/12/13 2031      Chief Complaint  Patient presents with  . Elbow Pain    (Consider location/radiation/quality/duration/timing/severity/associated sxs/prior treatment) HPI Comments: Pt state that she has been having right elbow pain for the last 2 months with it worsening over the last 2 days and it is hard for her to pick things up because of the pain;pt denies numbness or weakness:pt denies injury or any recent exercise:pt states that she has no history of injury to this area:pt denies swelling  The history is provided by the patient. No language interpreter was used.    Past Medical History  Diagnosis Date  . Substance abuse   . Uterine cancer   . Degenerative disk disease     Past Surgical History  Procedure Laterality Date  . Partial hysterectomy      No family history on file.  History  Substance Use Topics  . Smoking status: Current Every Day Smoker -- 1.00 packs/day for 20 years  . Smokeless tobacco: Not on file  . Alcohol Use: Yes    OB History   Grav Para Term Preterm Abortions TAB SAB Ect Mult Living                  Review of Systems  Constitutional: Negative.   Respiratory: Negative.     Allergies  Review of patient's allergies indicates no known allergies.  Home Medications   Current Outpatient Rx  Name  Route  Sig  Dispense  Refill  . cholecalciferol (VITAMIN D) 1000 UNITS tablet   Oral   Take 1,000 Units by mouth daily.         . cyclobenzaprine (FLEXERIL) 10 MG tablet   Oral   Take 1 tablet (10 mg total) by mouth 2 (two) times daily as needed for muscle spasms.   20 tablet   0   . fish oil-omega-3 fatty acids 1000 MG capsule   Oral   Take 2 g by mouth daily.         Chilton Si Tea, Camillia sinensis, (GREEN TEA PO)   Oral   Take 1 tablet by mouth daily.         Marland Kitchen HYDROcodone-acetaminophen (NORCO/VICODIN) 5-325 MG per  tablet   Oral   Take 1 tablet by mouth every 4 (four) hours as needed for pain.   10 tablet   0   . ibuprofen (ADVIL,MOTRIN) 200 MG tablet   Oral   Take 600-800 mg by mouth daily. For pain         . Multiple Vitamin (MULTIVITAMIN WITH MINERALS) TABS   Oral   Take 1 tablet by mouth daily.         Marland Kitchen oxyCODONE-acetaminophen (PERCOCET/ROXICET) 5-325 MG per tablet   Oral   Take 1 tablet by mouth every 4 (four) hours as needed for pain.   6 tablet   0   . vitamin B-12 (CYANOCOBALAMIN) 1000 MCG tablet   Oral   Take 2,000 mcg by mouth 2 (two) times daily.          . vitamin C (ASCORBIC ACID) 500 MG tablet   Oral   Take 1,500 mg by mouth daily.           BP 143/78  Pulse 89  Temp(Src) 98.5 F (36.9 C) (Oral)  Resp 20  Wt 115 lb (52.164 kg)  BMI 22.64 kg/m2  SpO2 97%  Physical Exam  Nursing note and vitals reviewed. Constitutional: She is oriented to person, place, and time. She appears well-developed and well-nourished.  Cardiovascular: Normal rate and regular rhythm.   Pulmonary/Chest: Effort normal and breath sounds normal.  Musculoskeletal: Normal range of motion.  Pt tender on the posterior elbow:no swelling or redness noted:pt has full ZOX:WRUEA grip strength noted  Neurological: She is alert and oriented to person, place, and time.  Skin: Skin is warm and dry.  Psychiatric: She has a normal mood and affect.    ED Course  Procedures (including critical care time)  Labs Reviewed - No data to display Dg Elbow Complete Right  03/12/2013  *RADIOLOGY REPORT*  Clinical Data: Right elbow pain for 2 months  RIGHT ELBOW - COMPLETE 3+ VIEW  Comparison: Right elbow - 08/15/2011  Findings: No displaced fracture or elbow joint effusion.  Joint spaces are preserved.  Regional soft tissues are normal.  IMPRESSION: No displaced fracture or elbow joint effusion.   Original Report Authenticated By: Tacey Ruiz, MD      1. Elbow pain, right       MDM  No acute bony  injury noted:pt is neurovascularly intact:pt given hydrocodone for pain and follow up to Dr. Vivi Barrack, NP 03/12/13 2053

## 2013-03-12 NOTE — ED Notes (Signed)
Right elbow pain x 2 months.

## 2013-03-12 NOTE — ED Notes (Signed)
Pt denies any known injury to elbow.

## 2013-03-13 NOTE — ED Provider Notes (Signed)
Medical screening examination/treatment/procedure(s) were performed by non-physician practitioner and as supervising physician I was immediately available for consultation/collaboration.  Geoffery Lyons, MD 03/13/13 [phone]

## 2013-06-01 ENCOUNTER — Emergency Department (HOSPITAL_COMMUNITY): Payer: BC Managed Care – PPO

## 2013-06-01 ENCOUNTER — Emergency Department (HOSPITAL_COMMUNITY)
Admission: EM | Admit: 2013-06-01 | Discharge: 2013-06-02 | Disposition: A | Discharge status: Home / Self Care | Payer: BC Managed Care – PPO | Attending: Emergency Medicine | Admitting: Emergency Medicine

## 2013-06-01 ENCOUNTER — Encounter (HOSPITAL_COMMUNITY): Payer: Self-pay | Admitting: *Deleted

## 2013-06-01 DIAGNOSIS — R197 Diarrhea, unspecified: Secondary | ICD-10-CM | POA: Insufficient documentation

## 2013-06-01 DIAGNOSIS — Z90710 Acquired absence of both cervix and uterus: Secondary | ICD-10-CM | POA: Insufficient documentation

## 2013-06-01 DIAGNOSIS — F329 Major depressive disorder, single episode, unspecified: Secondary | ICD-10-CM | POA: Insufficient documentation

## 2013-06-01 DIAGNOSIS — Z79899 Other long term (current) drug therapy: Secondary | ICD-10-CM | POA: Insufficient documentation

## 2013-06-01 DIAGNOSIS — Z8542 Personal history of malignant neoplasm of other parts of uterus: Secondary | ICD-10-CM | POA: Insufficient documentation

## 2013-06-01 DIAGNOSIS — N200 Calculus of kidney: Secondary | ICD-10-CM

## 2013-06-01 DIAGNOSIS — F3289 Other specified depressive episodes: Secondary | ICD-10-CM | POA: Insufficient documentation

## 2013-06-01 DIAGNOSIS — R109 Unspecified abdominal pain: Secondary | ICD-10-CM | POA: Insufficient documentation

## 2013-06-01 DIAGNOSIS — R112 Nausea with vomiting, unspecified: Secondary | ICD-10-CM | POA: Insufficient documentation

## 2013-06-01 DIAGNOSIS — R110 Nausea: Secondary | ICD-10-CM

## 2013-06-01 DIAGNOSIS — IMO0002 Reserved for concepts with insufficient information to code with codable children: Secondary | ICD-10-CM | POA: Insufficient documentation

## 2013-06-01 HISTORY — DX: Depression, unspecified: F32.A

## 2013-06-01 HISTORY — DX: Major depressive disorder, single episode, unspecified: F32.9

## 2013-06-01 HISTORY — DX: Reserved for concepts with insufficient information to code with codable children: IMO0002

## 2013-06-01 LAB — COMPREHENSIVE METABOLIC PANEL
AST: 27 U/L (ref 0–37)
Albumin: 3.9 g/dL (ref 3.5–5.2)
CO2: 23 mEq/L (ref 19–32)
Calcium: 9.4 mg/dL (ref 8.4–10.5)
Creatinine, Ser: 0.72 mg/dL (ref 0.50–1.10)
Glucose, Bld: 134 mg/dL — ABNORMAL HIGH (ref 70–99)
Potassium: 3.5 mEq/L (ref 3.5–5.1)
Sodium: 140 mEq/L (ref 135–145)
Total Protein: 7.6 g/dL (ref 6.0–8.3)

## 2013-06-01 LAB — CBC WITH DIFFERENTIAL/PLATELET
Eosinophils Relative: 4 % (ref 0–5)
Hemoglobin: 13.5 g/dL (ref 12.0–15.0)
Lymphocytes Relative: 21 % (ref 12–46)
Lymphs Abs: 1.9 10*3/uL (ref 0.7–4.0)
MCV: 88.3 fL (ref 78.0–100.0)
Neutrophils Relative %: 64 % (ref 43–77)
Platelets: 350 10*3/uL (ref 150–400)
RBC: 4.53 MIL/uL (ref 3.87–5.11)
WBC: 8.8 10*3/uL (ref 4.0–10.5)

## 2013-06-01 LAB — URINE MICROSCOPIC-ADD ON

## 2013-06-01 LAB — URINALYSIS, ROUTINE W REFLEX MICROSCOPIC
Glucose, UA: NEGATIVE mg/dL
Ketones, ur: 15 mg/dL — AB
Leukocytes, UA: NEGATIVE
Nitrite: NEGATIVE
Specific Gravity, Urine: >1.046 — ABNORMAL HIGH (ref 1.005–1.030)
pH: 6 (ref 5.0–8.0)

## 2013-06-01 LAB — LIPASE, BLOOD: Lipase: 33 U/L (ref 11–59)

## 2013-06-01 MED ORDER — KETOROLAC TROMETHAMINE 30 MG/ML IJ SOLN
30.0000 mg | Freq: Once | INTRAMUSCULAR | Status: AC
Start: 2013-06-01 — End: 2013-06-01
  Administered 2013-06-01: 30 mg via INTRAVENOUS
  Filled 2013-06-01: qty 1

## 2013-06-01 MED ORDER — IOHEXOL 300 MG/ML  SOLN
100.0000 mL | Freq: Once | INTRAMUSCULAR | Status: AC | PRN
  Administered 2013-06-01: 100 mL via INTRAVENOUS

## 2013-06-01 MED ORDER — MORPHINE SULFATE 4 MG/ML IJ SOLN
4.0000 mg | Freq: Once | INTRAMUSCULAR | Status: AC
  Administered 2013-06-01: 4 mg via INTRAVENOUS
  Filled 2013-06-01: qty 1

## 2013-06-01 MED ORDER — TRAMADOL HCL 50 MG PO TABS: 50.0000 mg | ORAL_TABLET | Freq: Four times a day (QID) | ORAL | Status: DC | PRN

## 2013-06-01 MED ORDER — SODIUM CHLORIDE 0.9 % IV SOLN
Freq: Once | INTRAVENOUS | Status: AC
  Administered 2013-06-01: 1000 mL via INTRAVENOUS

## 2013-06-01 MED ORDER — ONDANSETRON HCL 4 MG/2ML IJ SOLN
4.0000 mg | Freq: Once | INTRAMUSCULAR | Status: AC
  Administered 2013-06-01: 4 mg via INTRAVENOUS
  Filled 2013-06-01: qty 2

## 2013-06-01 MED ORDER — ONDANSETRON HCL 4 MG PO TABS: 4.0000 mg | ORAL_TABLET | Freq: Four times a day (QID) | ORAL | Status: DC

## 2013-06-01 MED ORDER — IOHEXOL 300 MG/ML  SOLN
50.0000 mL | Freq: Once | INTRAMUSCULAR | Status: AC | PRN
  Administered 2013-06-01: 50 mL via ORAL

## 2013-06-01 NOTE — ED Notes (Signed)
Bed:WA16<BR> Expected date:<BR> Expected time:<BR> Means of arrival:<BR> Comments:<BR> abd pain

## 2013-06-01 NOTE — ED Notes (Signed)
Per ptar - pt from home, c/o lower abd pain that started today, tender to palpation. Reports n/v/d, ems did not witness. Hx of kidney stones, ovarian cyst, and hysterectomy. Reports not taking prosac x2 days d/t running out of rx. bp 120 palpated, pulse 110, respirations 24, saO2 98% ra.

## 2013-06-01 NOTE — ED Provider Notes (Signed)
History     CSN: [id number]  Arrival date & time 06/01/13  1840   First MD Initiated Contact with Patient 06/01/13 1846      Chief Complaint  Patient presents with  . Abdominal Pain    (Consider location/radiation/quality/duration/timing/severity/associated sxs/prior treatment) HPI Comments: Patient is a 47 yo F PMHx significant for uterine cancer, depression, substance abuse presenting to the ED for acute onset worsening stabbing lower abdominal pain w/o radiation that began this morning. Patient rates pain 10/10. Alleviating factors including laying with her knees bent. Aggravating factors laying flat. Patient reports associated nausea, non-bloody non-bilious vomiting, and non-bloody diarrhea. Denies fevers, chills, urinary symptoms. Patient has a surgical history for partial hysterectomy.     Past Medical History  Diagnosis Date  . Substance abuse   . Uterine cancer   . Degenerative disk disease   . Depression   . Degenerative disc disease     Past Surgical History  Procedure Laterality Date  . Partial hysterectomy      No family history on file.  History  Substance Use Topics  . Smoking status: Current Every Day Smoker -- 1.00 packs/day for 20 years    Types: Cigarettes  . Smokeless tobacco: Not on file  . Alcohol Use: Yes    OB History   Grav Para Term Preterm Abortions TAB SAB Ect Mult Living                  Review of Systems  Constitutional: Negative for fever.  Respiratory: Negative for cough and shortness of breath.   Cardiovascular: Negative for chest pain.  Gastrointestinal: Positive for nausea, vomiting, abdominal pain and diarrhea. Negative for blood in stool.  Genitourinary: Negative for dysuria.  All other systems reviewed and are negative.    Allergies  Review of patient's allergies indicates no known allergies.  Home Medications   Current Outpatient Rx  Name  Route  Sig  Dispense  Refill  . acetaminophen (TYLENOL) 500 MG tablet    Oral   Take 500 mg by mouth every 6 (six) hours as needed for pain.         . cholecalciferol (VITAMIN D) 1000 UNITS tablet   Oral   Take 1,000 Units by mouth daily.         . fish oil-omega-3 fatty acids 1000 MG capsule   Oral   Take 2 g by mouth daily.         Marland Kitchen FLUoxetine (PROZAC) 40 MG capsule   Oral   Take 40 mg by mouth daily.         Chilton Si Tea, Camillia sinensis, (GREEN TEA PO)   Oral   Take 1 tablet by mouth daily.         Marland Kitchen HYDROcodone-acetaminophen (NORCO/VICODIN) 5-325 MG per tablet   Oral   Take 1 tablet by mouth every 4 (four) hours as needed for pain.   10 tablet   0   . ibuprofen (ADVIL,MOTRIN) 200 MG tablet   Oral   Take 600-800 mg by mouth daily. For pain         . Multiple Vitamin (MULTIVITAMIN WITH MINERALS) TABS   Oral   Take 1 tablet by mouth daily.         . vitamin B-12 (CYANOCOBALAMIN) 1000 MCG tablet   Oral   Take 2,000 mcg by mouth 2 (two) times daily.          . vitamin C (ASCORBIC ACID) 500 MG tablet  Oral   Take 1,500 mg by mouth daily.         . ondansetron (ZOFRAN) 4 MG tablet   Oral   Take 1 tablet (4 mg total) by mouth every 6 (six) hours.   12 tablet   0   . traMADol (ULTRAM) 50 MG tablet   Oral   Take 1 tablet (50 mg total) by mouth every 6 (six) hours as needed for pain.   15 tablet   0     BP 132/68  Pulse 65  Temp(Src) 98.4 F (36.9 C) (Oral)  Resp 20  SpO2 99%  Physical Exam  Constitutional: She is oriented to person, place, and time. She appears well-developed and well-nourished.  HENT:  Head: Normocephalic and atraumatic.  Eyes: EOM are normal. Pupils are equal, round, and reactive to light.  Cardiovascular: Normal rate, regular rhythm and normal heart sounds.   Pulmonary/Chest: Effort normal and breath sounds normal.  Abdominal: Soft. Bowel sounds are normal. There is tenderness in the right lower quadrant, suprapubic area and left lower quadrant. There is tenderness at McBurney's  point.  Neurological: She is alert and oriented to person, place, and time.  Skin: Skin is warm and dry.  Psychiatric: She has a normal mood and affect.    ED Course  Procedures (including critical care time)  Labs Reviewed  COMPREHENSIVE METABOLIC PANEL - Abnormal; Notable for the following:    Glucose, Bld 134 (*)    All other components within normal limits  URINALYSIS, ROUTINE W REFLEX MICROSCOPIC - Abnormal; Notable for the following:    Specific Gravity, Urine >1.046 (*)    Hgb urine dipstick SMALL (*)    Ketones, ur 15 (*)    All other components within normal limits  CBC WITH DIFFERENTIAL  LIPASE, BLOOD  URINE MICROSCOPIC-ADD ON   Ct Abdomen Pelvis W Contrast  06/01/2013   *RADIOLOGY REPORT*  Clinical Data:  Lower abdominal pain, nausea, vomiting, diarrhea, remote history uterine cancer  CT ABDOMEN AND PELVIS WITH CONTRAST  Technique:  Multidetector CT imaging of the abdomen and pelvis was performed following the standard protocol during bolus administration of intravenous contrast. Sagittal and coronal MPR images reconstructed from axial data set.  Contrast: 50mL OMNIPAQUE IOHEXOL 300 MG/ML  SOLN, OMNIPAQUE IOHEXOL 300 MG/ML  SOLN  Comparison: 10/07/2010  Findings: Lung bases clear. 3 mm nonobstructing calculus lower pole left kidney image 33. Liver, spleen, pancreas, kidneys, and adrenal glands otherwise normal. Small splenule at the splenic hilum. Uterus surgically absent with nonvisualization of left ovary and questionable visualization of a normal sized right ovary. Appendix not identified.  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 Minimal atherosclerotic calcification aorta. Stomach and bowel loops normal appearance. No mass, adenopathy, free fluid or inflammatory process. No hernia or acute bony lesion. 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  IMPRESSION: Nonobstructing lower pole calculus left kidney.  No acute intra-abdominal or intrapelvic abnormalities identified. 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 Grade 1 spondylolisthesis L5-S1 secondary to bilateral spondylolysis L5.   Original Report Authenticated By: Ulyses Southward, M.D.     1. Abdominal pain   2. Kidney stone   3. Nausea       MDM  Patient is nontoxic, nonseptic appearing, in no apparent distress.  Patient's pain and other symptoms adequately managed in emergency department.  Fluid bolus given.  Labs, imaging and vitals reviewed.  Patient does not meet the SIRS  or Sepsis criteria.  On repeat exam patient does not have a surgical abdomen and there are nor peritoneal signs.  No indication of appendicitis, bowel obstruction, bowel perforation, cholecystitis, diverticulitis or ectopic pregnancy.  Patient discharged home with symptomatic treatment and given strict return precautions at this time.  I have also discussed reasons to return immediately to the ER.  Patient expresses understanding and agrees with plan. Patient d/w with Dr. Juleen China, agrees with plan. Patient is stable at time of discharge.             Jeannetta Ellis, PA-C 06/02/13 0017

## 2013-06-01 NOTE — ED Notes (Signed)
Patient transported to CT 

## 2013-06-08 NOTE — ED Provider Notes (Signed)
Medical screening examination/treatment/procedure(s) were performed by non-physician practitioner and as supervising physician I was immediately available for consultation/collaboration.  Demi Trieu, MD 06/08/13 1652 

## 2013-11-22 ENCOUNTER — Emergency Department (HOSPITAL_COMMUNITY): Payer: Self-pay

## 2013-11-22 ENCOUNTER — Encounter (HOSPITAL_COMMUNITY): Payer: Self-pay | Admitting: Emergency Medicine

## 2013-11-22 ENCOUNTER — Emergency Department (HOSPITAL_COMMUNITY)
Admission: EM | Admit: 2013-11-22 | Discharge: 2013-11-22 | Disposition: A | Discharge status: Home / Self Care | Payer: Self-pay | Attending: Emergency Medicine | Admitting: Emergency Medicine

## 2013-11-22 DIAGNOSIS — Z8659 Personal history of other mental and behavioral disorders: Secondary | ICD-10-CM | POA: Insufficient documentation

## 2013-11-22 DIAGNOSIS — J36 Peritonsillar abscess: Secondary | ICD-10-CM | POA: Insufficient documentation

## 2013-11-22 DIAGNOSIS — IMO0002 Reserved for concepts with insufficient information to code with codable children: Secondary | ICD-10-CM | POA: Insufficient documentation

## 2013-11-22 DIAGNOSIS — F17200 Nicotine dependence, unspecified, uncomplicated: Secondary | ICD-10-CM | POA: Insufficient documentation

## 2013-11-22 DIAGNOSIS — D72829 Elevated white blood cell count, unspecified: Secondary | ICD-10-CM | POA: Insufficient documentation

## 2013-11-22 DIAGNOSIS — Z8541 Personal history of malignant neoplasm of cervix uteri: Secondary | ICD-10-CM | POA: Insufficient documentation

## 2013-11-22 DIAGNOSIS — Z79899 Other long term (current) drug therapy: Secondary | ICD-10-CM | POA: Insufficient documentation

## 2013-11-22 LAB — CBC WITH DIFFERENTIAL/PLATELET
Basophils Relative: 0 % (ref 0–1)
HCT: 39.9 % (ref 36.0–46.0)
Hemoglobin: 13.7 g/dL (ref 12.0–15.0)
Lymphocytes Relative: 13 % (ref 12–46)
Lymphs Abs: 1.8 10*3/uL (ref 0.7–4.0)
Monocytes Absolute: 1.4 10*3/uL — ABNORMAL HIGH (ref 0.1–1.0)
Monocytes Relative: 11 % (ref 3–12)
Neutro Abs: 10 10*3/uL — ABNORMAL HIGH (ref 1.7–7.7)
Neutrophils Relative %: 75 % (ref 43–77)
RBC: 4.6 MIL/uL (ref 3.87–5.11)
WBC: 13.3 10*3/uL — ABNORMAL HIGH (ref 4.0–10.5)

## 2013-11-22 LAB — BASIC METABOLIC PANEL
BUN: 11 mg/dL (ref 6–23)
CO2: 23 mEq/L (ref 19–32)
Chloride: 100 mEq/L (ref 96–112)
GFR calc Af Amer: >90 mL/min (ref >90)
Glucose, Bld: 101 mg/dL — ABNORMAL HIGH (ref 70–99)
Potassium: 4 mEq/L (ref 3.5–5.1)

## 2013-11-22 MED ORDER — DEXAMETHASONE SODIUM PHOSPHATE 10 MG/ML IJ SOLN
10.0000 mg | Freq: Once | INTRAMUSCULAR | Status: AC
  Administered 2013-11-22: 10 mg via INTRAVENOUS
  Filled 2013-11-22: qty 1

## 2013-11-22 MED ORDER — KETOROLAC TROMETHAMINE 30 MG/ML IJ SOLN
30.0000 mg | Freq: Once | INTRAMUSCULAR | Status: AC
  Administered 2013-11-22: 30 mg via INTRAVENOUS
  Filled 2013-11-22: qty 1

## 2013-11-22 MED ORDER — MORPHINE SULFATE 4 MG/ML IJ SOLN
4.0000 mg | Freq: Once | INTRAMUSCULAR | Status: AC
  Administered 2013-11-22: 4 mg via INTRAVENOUS
  Filled 2013-11-22: qty 1

## 2013-11-22 MED ORDER — SODIUM CHLORIDE 0.9 % IV BOLUS (SEPSIS)
1000.0000 mL | Freq: Once | INTRAVENOUS | Status: AC
  Administered 2013-11-22: 1000 mL via INTRAVENOUS

## 2013-11-22 MED ORDER — IOHEXOL 300 MG/ML  SOLN
80.0000 mL | Freq: Once | INTRAMUSCULAR | Status: AC | PRN
  Administered 2013-11-22: 80 mL via INTRAVENOUS

## 2013-11-22 NOTE — ED Provider Notes (Signed)
CSN: [id number]     Arrival date & time 11/22/13  1478 History   First MD Initiated Contact with Patient 11/22/13 0919     Chief Complaint  Patient presents with  . Sore Throat   (Consider location/radiation/quality/duration/timing/severity/associated sxs/prior Treatment) Patient is a 47 y.o. female presenting with pharyngitis. The history is provided by the patient. No language interpreter was used.  Sore Throat This is a new problem. The current episode started in the past 7 days. The problem has been gradually worsening. Associated symptoms include a sore throat. Pertinent negatives include no abdominal pain, fever, nausea, rash, urinary symptoms or vomiting.  pt is a 47 year old female who presents with complaints of sore throat gradually worsening for the last three days. She denies any fever, chills or exposure to illness. She reports that she has had difficulty swallowing in the last 24 hours. She reports that she can drink liquids but cannot swallow any solid food. She denies any recent problems with her teeth, jaw or recent dental work. No difficulty breathing or shortness of breath. She does report that she is a regular smoker.     Past Medical History  Diagnosis Date  . Substance abuse   . Uterine cancer   . Degenerative disk disease   . Depression   . Degenerative disc disease    Past Surgical History  Procedure Laterality Date  . Partial hysterectomy     No family history on file. History  Substance Use Topics  . Smoking status: Current Every Day Smoker -- 1.00 packs/day for 20 years    Types: Cigarettes  . Smokeless tobacco: Not on file  . Alcohol Use: Yes   OB History   Grav Para Term Preterm Abortions TAB SAB Ect Mult Living                 Review of Systems  Constitutional: Negative for fever.  HENT: Positive for sore throat and trouble swallowing. Negative for mouth sores.   Gastrointestinal: Negative for nausea, vomiting and abdominal pain.  Skin:  Negative for rash.  All other systems reviewed and are negative.    Allergies  Review of patient's allergies indicates no known allergies.  Home Medications   Current Outpatient Rx  Name  Route  Sig  Dispense  Refill  . acetaminophen (TYLENOL) 500 MG tablet   Oral   Take 500 mg by mouth every 6 (six) hours as needed for pain.         . cholecalciferol (VITAMIN D) 1000 UNITS tablet   Oral   Take 1,000 Units by mouth daily.         . fish oil-omega-3 fatty acids 1000 MG capsule   Oral   Take 2 g by mouth daily.         Chilton Si Tea, Camillia sinensis, (GREEN TEA PO)   Oral   Take 1 tablet by mouth daily.         Marland Kitchen HYDROcodone-acetaminophen (NORCO/VICODIN) 5-325 MG per tablet   Oral   Take 1 tablet by mouth every 4 (four) hours as needed for pain.   10 tablet   0   . Multiple Vitamin (MULTIVITAMIN WITH MINERALS) TABS   Oral   Take 1 tablet by mouth daily.         . vitamin B-12 (CYANOCOBALAMIN) 1000 MCG tablet   Oral   Take 2,000 mcg by mouth 2 (two) times daily.          . vitamin  C (ASCORBIC ACID) 500 MG tablet   Oral   Take 1,500 mg by mouth daily.          BP 136/77  Pulse 82  Temp(Src) 98.1 F (36.7 C) (Oral)  Resp 18  SpO2 98% Physical Exam  Nursing note and vitals reviewed. Constitutional: She is oriented to person, place, and time. She appears well-developed and well-nourished. No distress.  HENT:  Head: Atraumatic.  Right Ear: External ear normal.  Left Ear: External ear normal.  Mouth/Throat: Mucous membranes are normal. No oropharyngeal exudate.  Para-tonsillar edema no erythema or exudates noted. Uvula slight deviation to the left. Difficulty swallowing. Externally tender to palpation right neck with no obvious external edema.  Eyes: Conjunctivae and EOM are normal.  Neck: Normal range of motion. Neck supple. No JVD present. No tracheal deviation present. No thyromegaly present.  Cardiovascular: Normal rate, regular rhythm, normal  heart sounds and intact distal pulses.   Pulmonary/Chest: Effort normal and breath sounds normal. No respiratory distress. She has no wheezes.  Abdominal: Soft. Bowel sounds are normal. She exhibits no distension. There is no tenderness.  Musculoskeletal: Normal range of motion.  Lymphadenopathy:    She has cervical adenopathy.  Neurological: She is alert and oriented to person, place, and time.  Skin: Skin is warm and dry.  Psychiatric: She has a normal mood and affect. Her behavior is normal. Judgment and thought content normal.    ED Course  Procedures (including critical care time) Labs Review Labs Reviewed  CBC WITH DIFFERENTIAL - Abnormal; Notable for the following:    WBC 13.3 (*)    Neutro Abs 10.0 (*)    Monocytes Absolute 1.4 (*)    All other components within normal limits  BASIC METABOLIC PANEL - Abnormal; Notable for the following:    Sodium 134 (*)    Glucose, Bld 101 (*)    All other components within normal limits  RAPID STREP SCREEN  CULTURE, GROUP A STREP   Imaging Review Ct Soft Tissue Neck W Contrast  11/22/2013   CLINICAL DATA:  Sore throat. Lump on right side of throat but has progressed with pain and now unable to swallow. No fever.  EXAM: CT NECK WITH CONTRAST  TECHNIQUE: Multidetector CT imaging of the neck was performed using the standard protocol following the bolus administration of intravenous contrast.  CONTRAST:  80mL OMNIPAQUE IOHEXOL 300 MG/ML  SOLN  COMPARISON:  None.  FINDINGS: The visualized portion the brain is unremarkable. Orbits are normal. There is opacification of multiple ethmoid air cells bilaterally. A moderate air-fluid level is present in the left maxillary sinus. There is mild right maxillary sinus mucosal thickening. Trace fluid is also noted in the sphenoid sinus. The mastoid air cells are clear.  There is asymmetric enlargement of the right palatine tonsil. Within the tonsil is a rim enhancing low density collection measuring 13 x 9 x  11 mm (series 3, image 41). Edema extends posteriorly and inferiorly into the retropharyngeal soft tissues on the right. The airway remains patent. Right level II lymph nodes measure up to 1.2 cm in short axis, likely reactive.  Normal intravascular enhancement is seen. The parotid and submandibular glands are unremarkable. Incidental note is made of a 4 mm nodule in the left thyroid lobe. Visualized lung apices are clear. Moderate disc space narrowing and endplate spurring are present at C5-6 and C6-7. Moderate to severe facet arthrosis is present bilaterally at C2-3 and on the left at C3-4 and C4-5.  IMPRESSION:  1. Right-sided tonsillitis with small right tonsillar abscess. 2. Paranasal sinus mucosal thickening and fluid. Please correlate clinically for signs of acute sinusitis. 3. Multilevel degenerative disc disease and moderate to severe facet arthrosis in the cervical spine. These results were called by telephone at the time of interpretation on 11/22/2013 at 12:33 PM to Dr. Irish Elders , who verbally acknowledged these results.   Electronically Signed   By: Sebastian Ache   On: 11/22/2013 12:33    EKG Interpretation   None       MDM   1. Tonsillar abscess     Peri-tonsillar abscess. Mild leukocytosis. No difficulty breathing or shortness of breath. Able to swallow liquids but not solid foods. Airway intact and VSS.  Afebrile. ENT, Dr. Emeline Darling consulted and asked to please send pt directly to his office to be seen now. Plan discussed with pt and she agrees. Pt discharged with instructions to go directly to Dr. Ellyn Hack office to be seen.     Irish Elders, NP 11/22/13 [phone]

## 2013-11-22 NOTE — ED Notes (Signed)
Pt states that on Tuesday she started having lump on right side of throat that has progressed with pain and now unable to swallow to eat/drink bc of pain. Pt denies any fever, n/v.

## 2013-11-22 NOTE — Progress Notes (Signed)
[facility] provided pt with a list of primary care resources, [facility] application, and [facility] information.  °

## 2013-11-22 NOTE — ED Notes (Addendum)
Per Irish Elders NP pt is being discharged with PIV in  Place. Barbara Cower AD consulted and approved. French Ana RN at dr Ellyn Hack office also notified and given report on this pt. Will contact [facility] transportation to transport pt due to her lack of transport, per Administrator

## 2013-11-25 LAB — CULTURE, GROUP A STREP

## 2013-11-25 NOTE — ED Provider Notes (Signed)
Medical screening examination/treatment/procedure(s) were conducted as a shared visit with non-physician practitioner(s) and myself.  I personally evaluated the patient during the encounter.  EKG Interpretation   None      Pt seen and with right tonsilar pillar edema without erythema--uvula shifted to the left, voice normal, pt afebrile, still concern for peritonsillar abscess, obtained neck ct that was positive for pta, spoke with ent, request that pt go to their office  Toy Baker, MD 11/25/13 [phone]

## 2013-11-26 ENCOUNTER — Other Ambulatory Visit (HOSPITAL_COMMUNITY): Payer: Self-pay | Admitting: Otolaryngology

## 2013-12-18 ENCOUNTER — Encounter (HOSPITAL_COMMUNITY): Payer: Self-pay

## 2013-12-18 ENCOUNTER — Encounter (HOSPITAL_COMMUNITY)
Admission: RE | Admit: 2013-12-18 | Discharge: 2013-12-18 | Disposition: A | Discharge status: Home / Self Care | Payer: BC Managed Care – PPO | Source: Ambulatory Visit | Attending: Otolaryngology | Admitting: Otolaryngology

## 2013-12-18 DIAGNOSIS — Z01812 Encounter for preprocedural laboratory examination: Secondary | ICD-10-CM | POA: Insufficient documentation

## 2013-12-18 DIAGNOSIS — Z01818 Encounter for other preprocedural examination: Secondary | ICD-10-CM | POA: Insufficient documentation

## 2013-12-18 HISTORY — DX: Headache: R51

## 2013-12-18 LAB — COMPREHENSIVE METABOLIC PANEL
ALBUMIN: 4.1 g/dL (ref 3.5–5.2)
ALT: 13 U/L (ref 0–35)
AST: 20 U/L (ref 0–37)
Alkaline Phosphatase: 61 U/L (ref 39–117)
BUN: 14 mg/dL (ref 6–23)
CALCIUM: 9.3 mg/dL (ref 8.4–10.5)
CHLORIDE: 103 meq/L (ref 96–112)
CO2: 27 meq/L (ref 19–32)
Creatinine, Ser: 0.62 mg/dL (ref 0.50–1.10)
GFR calc Af Amer: >90 mL/min (ref >90)
Glucose, Bld: 88 mg/dL (ref 70–99)
Potassium: 4.8 mEq/L (ref 3.7–5.3)
SODIUM: 143 meq/L (ref 137–147)
Total Bilirubin: 0.8 mg/dL (ref 0.3–1.2)
Total Protein: 7.8 g/dL (ref 6.0–8.3)

## 2013-12-18 LAB — CBC
HCT: 43.4 % (ref 36.0–46.0)
Hemoglobin: 14.8 g/dL (ref 12.0–15.0)
MCH: 30 pg (ref 26.0–34.0)
MCHC: 34.1 g/dL (ref 30.0–36.0)
MCV: 87.9 fL (ref 78.0–100.0)
PLATELETS: 341 10*3/uL (ref 150–400)
RBC: 4.94 MIL/uL (ref 3.87–5.11)
RDW: 14.7 % (ref 11.5–15.5)
WBC: 7.2 10*3/uL (ref 4.0–10.5)

## 2013-12-18 NOTE — Pre-Procedure Instructions (Signed)
Carreen M Utter  12/18/2013   Your procedure is scheduled on:  Friday, December 20, 2013 @ 7:30 AM  Report to [facility] Short Stay (use Main Entrance "A'') at 5:30 AM.  Call this number if you have problems the morning of surgery: [phone]   Remember:   Do not eat food or drink liquids after midnight.   Take these medicines the morning of surgery with A SIP OF WATER: If needed: acetaminophen  (TYLENOL) 500 MG tablet  OR  HYDROcodone-acetaminophen (NORCO/VICODIN) 5-325 MG per  Tablet for pain Stop taking Aspirin, vitamins and herbal medications ( fish oil-omega-3 fatty acids 1000 MG capsule,Green Tea, Camillia sinensis, (GREEN TEA PO) Do not take any NSAIDs ie: Ibuprofen, Advil, Naproxen or any medication containing Aspirin.  Do not wear jewelry, make-up or nail polish.  Do not wear lotions, powders, or perfumes. You may wear deodorant.  Do not shave 48 hours prior to surgery.   Do not bring valuables to the hospital.  [facility] is not responsible for any belongings or valuables.               Contacts, dentures or bridgework may not be worn into surgery.  Leave suitcase in the car. After surgery it may be brought to your room.  For patients admitted to the hospital, discharge time is determined by your treatment team.               Patients discharged the day of surgery will not be allowed to drive home.  Name and phone number of your driver:   Special Instructions: Shower using CHG 2 nights before surgery and the night before surgery.  If you shower the day of surgery use CHG.  Use special wash - you have one bottle of CHG for all showers.  You should use approximately 1/3 of the bottle for each shower.   Please read over the following fact sheets that you were given: Pain Booklet and Surgical Site Infection Prevention

## 2013-12-19 NOTE — H&P (Signed)
Mallory Shaw 12/20/13 6:49 AM  PREOPERATIVE HISTORY AND PHYSICAL  CHIEF COMPLAINT: history of right peritonsillar abscess and acute on chronic tonsillitis with tonsillar hypertrophy  HISTORY: This is a 48 year old who presents with a history of right peritonsillar abscess and acute on chronic tonsillitis with tonsillar hypertrophy.  She now presents for bilateral tonsillectomy.  Dr. Simeon Craft, Alroy Dust has discussed the risks (bleeding, infection, oropharyngeal hemorrhage, pain, dehydration, risks of general anesthesia, etc.), benefits, and alternatives of this procedure. The patient understands the risks and would like to proceed with the procedure. The chances of success of the procedure are >50% and the patient understands this. I personally performed an examination of the patient within 24 hours of the procedure.  PAST MEDICAL HISTORY: Past Medical History  Diagnosis Date  . Substance abuse   . Uterine cancer   . Degenerative disk disease   . Depression   . Degenerative disc disease   . Headache(784.0)     PAST SURGICAL HISTORY: Past Surgical History  Procedure Laterality Date  . Partial hysterectomy    . Tubal ligation    . Breast lumpectomy      Hx: of right breast    MEDICATIONS: No current facility-administered medications on file prior to encounter.   Current Outpatient Prescriptions on File Prior to Encounter  Medication Sig Dispense Refill  . acetaminophen (TYLENOL) 500 MG tablet Take 500 mg by mouth every 6 (six) hours as needed for pain.      . cholecalciferol (VITAMIN D) 1000 UNITS tablet Take 1,000 Units by mouth daily.      . fish oil-omega-3 fatty acids 1000 MG capsule Take 2 g by mouth daily.      Nyoka Cowden Tea, Camillia sinensis, (GREEN TEA PO) Take 1 tablet by mouth daily.      Marland Kitchen HYDROcodone-acetaminophen (NORCO/VICODIN) 5-325 MG per tablet Take 1 tablet by mouth every 4 (four) hours as needed for pain.  10 tablet  0  . Multiple Vitamin (MULTIVITAMIN WITH  MINERALS) TABS Take 1 tablet by mouth daily.      . vitamin B-12 (CYANOCOBALAMIN) 1000 MCG tablet Take 2,000 mcg by mouth 2 (two) times daily.       . vitamin C (ASCORBIC ACID) 500 MG tablet Take 1,500 mg by mouth daily.        ALLERGIES: Allergies  Allergen Reactions  . Tramadol Nausea And Vomiting     SOCIAL HISTORY: History   Social History  . Marital Status: Single    Spouse Name: N/A    Number of Children: N/A  . Years of Education: N/A   Occupational History  . Not on file.   Social History Main Topics  . Smoking status: Current Some Day Smoker -- 0.25 packs/day for 20 years    Types: Cigarettes  . Smokeless tobacco: Never Used  . Alcohol Use: Yes     Comment: Occasional  . Drug Use: No     Comment: opiates  . Sexual Activity: Not Currently    Birth Control/ Protection: None   Other Topics Concern  . Not on file   Social History Narrative  . No narrative on file    FAMILY HISTORY: Family History  Problem Relation Age of Onset  . Hypertension Mother     REVIEW OF SYSTEMS:  HEENT: resolved right sore throat, otherwise negative x 12 systems except per HPI   PHYSICAL EXAM:  GENERAL:  NAD VITAL SIGNS:  There were no vitals filed for this visit. SKIN:  Warm, dry  HEENT:  Mallampati class I with Freidman 2+ cryptic tonsils NECK:supple   LYMPH:  No LAD LUNGS:  Grossly clear CARDIOVASCULAR:  RRR ABDOMEN:  Soft, NT MUSCULOSKELETAL: normal strength PSYCH:  Normal affect NEUROLOGIC:  CN 2-12 intact and symmetric   ASSESSMENT AND PLAN: Plan to proceed with bilateral tonsillectomy. Patient understands the risks, benefits, and alternatives. Informed written consent signed witnessed and on chart. Mallory Shaw 12/20/13 6:50 AM

## 2013-12-20 ENCOUNTER — Encounter (HOSPITAL_COMMUNITY): Payer: Self-pay | Admitting: Anesthesiology

## 2013-12-20 ENCOUNTER — Encounter (HOSPITAL_COMMUNITY): Payer: Self-pay | Admitting: *Deleted

## 2013-12-20 ENCOUNTER — Encounter (HOSPITAL_COMMUNITY)
Admission: RE | Disposition: A | Discharge status: Home / Self Care | Payer: Self-pay | Source: Ambulatory Visit | Attending: Otolaryngology

## 2013-12-20 ENCOUNTER — Ambulatory Visit (HOSPITAL_COMMUNITY)
Admission: RE | Admit: 2013-12-20 | Discharge: 2013-12-20 | Disposition: A | Discharge status: Home / Self Care | Payer: Self-pay | Source: Ambulatory Visit | Attending: Otolaryngology | Admitting: Otolaryngology

## 2013-12-20 ENCOUNTER — Ambulatory Visit (HOSPITAL_COMMUNITY): Payer: Self-pay | Admitting: Anesthesiology

## 2013-12-20 DIAGNOSIS — R51 Headache: Secondary | ICD-10-CM | POA: Insufficient documentation

## 2013-12-20 DIAGNOSIS — R599 Enlarged lymph nodes, unspecified: Secondary | ICD-10-CM | POA: Insufficient documentation

## 2013-12-20 DIAGNOSIS — J3501 Chronic tonsillitis: Secondary | ICD-10-CM | POA: Insufficient documentation

## 2013-12-20 DIAGNOSIS — F17200 Nicotine dependence, unspecified, uncomplicated: Secondary | ICD-10-CM | POA: Insufficient documentation

## 2013-12-20 DIAGNOSIS — F1210 Cannabis abuse, uncomplicated: Secondary | ICD-10-CM | POA: Insufficient documentation

## 2013-12-20 HISTORY — PX: TONSILLECTOMY AND ADENOIDECTOMY: SHX28

## 2013-12-20 SURGERY — TONSILLECTOMY AND ADENOIDECTOMY
Anesthesia: General | Site: Mouth | Laterality: Bilateral

## 2013-12-20 MED ORDER — ONDANSETRON HCL 4 MG/2ML IJ SOLN: 4.0000 mg | Freq: Once | INTRAMUSCULAR | Status: DC | PRN

## 2013-12-20 MED ORDER — ONDANSETRON HCL 4 MG/2ML IJ SOLN
INTRAMUSCULAR | Status: DC | PRN
  Administered 2013-12-20: 4 mg via INTRAVENOUS

## 2013-12-20 MED ORDER — DEXAMETHASONE SODIUM PHOSPHATE 10 MG/ML IJ SOLN
10.0000 mg | Freq: Once | INTRAMUSCULAR | Status: AC
  Administered 2013-12-20: 10 mg via INTRAVENOUS
  Filled 2013-12-20: qty 1

## 2013-12-20 MED ORDER — SUCCINYLCHOLINE CHLORIDE 20 MG/ML IJ SOLN
INTRAMUSCULAR | Status: DC | PRN
  Administered 2013-12-20: 100 mg via INTRAVENOUS

## 2013-12-20 MED ORDER — LIDOCAINE HCL (CARDIAC) 20 MG/ML IV SOLN
INTRAVENOUS | Status: DC | PRN
  Administered 2013-12-20: 100 mg via INTRATRACHEAL
  Administered 2013-12-20: 100 mg via INTRAVENOUS

## 2013-12-20 MED ORDER — 0.9 % SODIUM CHLORIDE (POUR BTL) OPTIME
TOPICAL | Status: DC | PRN
  Administered 2013-12-20: 1000 mL

## 2013-12-20 MED ORDER — CLINDAMYCIN PHOSPHATE 600 MG/50ML IV SOLN
600.0000 mg | Freq: Once | INTRAVENOUS | Status: AC
  Administered 2013-12-20: 600 mg via INTRAVENOUS
  Filled 2013-12-20 (×2): qty 50

## 2013-12-20 MED ORDER — MIDAZOLAM HCL 5 MG/5ML IJ SOLN
INTRAMUSCULAR | Status: DC | PRN
  Administered 2013-12-20: 2 mg via INTRAVENOUS

## 2013-12-20 MED ORDER — PROPOFOL 10 MG/ML IV BOLUS
INTRAVENOUS | Status: DC | PRN
  Administered 2013-12-20: 130 mg via INTRAVENOUS

## 2013-12-20 MED ORDER — HYDROMORPHONE HCL PF 1 MG/ML IJ SOLN
INTRAMUSCULAR | Status: AC
  Filled 2013-12-20: qty 1

## 2013-12-20 MED ORDER — ARTIFICIAL TEARS OP OINT
TOPICAL_OINTMENT | OPHTHALMIC | Status: DC | PRN
  Administered 2013-12-20: 1 via OPHTHALMIC

## 2013-12-20 MED ORDER — HYDROMORPHONE HCL PF 1 MG/ML IJ SOLN
0.2500 mg | INTRAMUSCULAR | Status: DC | PRN
  Administered 2013-12-20 (×6): 0.5 mg via INTRAVENOUS

## 2013-12-20 MED ORDER — OXYMETAZOLINE HCL 0.05 % NA SOLN
NASAL | Status: DC | PRN
  Administered 2013-12-20: 1

## 2013-12-20 MED ORDER — LACTATED RINGERS IV SOLN
INTRAVENOUS | Status: DC | PRN
  Administered 2013-12-20 (×2): via INTRAVENOUS

## 2013-12-20 MED ORDER — FENTANYL CITRATE 0.05 MG/ML IJ SOLN
INTRAMUSCULAR | Status: DC | PRN
  Administered 2013-12-20 (×5): 50 ug via INTRAVENOUS

## 2013-12-20 MED ORDER — OXYMETAZOLINE HCL 0.05 % NA SOLN
NASAL | Status: AC
  Filled 2013-12-20: qty 15

## 2013-12-20 SURGICAL SUPPLY — 29 items
CANISTER SUCTION 2500CC (MISCELLANEOUS) ×3 IMPLANT
CATH ROBINSON RED A/P 10FR (CATHETERS) ×3 IMPLANT
CLEANER TIP ELECTROSURG 2X2 (MISCELLANEOUS) ×3 IMPLANT
CLOTH BEACON ORANGE TIMEOUT ST (SAFETY) IMPLANT
COAGULATOR SUCT 6 FR SWTCH (ELECTROSURGICAL) ×1
COAGULATOR SUCT SWTCH 10FR 6 (ELECTROSURGICAL) ×2 IMPLANT
ELECT COATED BLADE 2.86 ST (ELECTRODE) ×3 IMPLANT
ELECT REM PT RETURN 9FT ADLT (ELECTROSURGICAL) ×3
ELECT REM PT RETURN 9FT PED (ELECTROSURGICAL)
ELECTRODE REM PT RETRN 9FT PED (ELECTROSURGICAL) IMPLANT
ELECTRODE REM PT RTRN 9FT ADLT (ELECTROSURGICAL) ×1 IMPLANT
GAUZE SPONGE 4X4 16PLY XRAY LF (GAUZE/BANDAGES/DRESSINGS) ×3 IMPLANT
GLOVE SURG SS PI 7.5 STRL IVOR (GLOVE) ×3 IMPLANT
GOWN STRL NON-REIN LRG LVL3 (GOWN DISPOSABLE) ×6 IMPLANT
KIT BASIN OR (CUSTOM PROCEDURE TRAY) ×3 IMPLANT
KIT ROOM TURNOVER OR (KITS) ×3 IMPLANT
NS IRRIG 1000ML POUR BTL (IV SOLUTION) ×3 IMPLANT
PACK SURGICAL SETUP 50X90 (CUSTOM PROCEDURE TRAY) ×3 IMPLANT
PAD ARMBOARD 7.5X6 YLW CONV (MISCELLANEOUS) ×3 IMPLANT
PENCIL BUTTON HOLSTER BLD 10FT (ELECTRODE) ×3 IMPLANT
SPECIMEN JAR SMALL (MISCELLANEOUS) ×6 IMPLANT
SPONGE TONSIL 1 RF SGL (DISPOSABLE) ×3 IMPLANT
SYR BULB 3OZ (MISCELLANEOUS) ×3 IMPLANT
TOWEL OR 17X24 6PK STRL BLUE (TOWEL DISPOSABLE) ×6 IMPLANT
TUBE CONNECTING 12'X1/4 (SUCTIONS) ×1
TUBE CONNECTING 12X1/4 (SUCTIONS) ×2 IMPLANT
TUBE SALEM SUMP 12R W/ARV (TUBING) IMPLANT
WATER STERILE IRR 1000ML POUR (IV SOLUTION) IMPLANT
YANKAUER SUCT BULB TIP NO VENT (SUCTIONS) ×3 IMPLANT

## 2013-12-20 NOTE — Preoperative (Signed)
Beta Blockers   Reason not to administer Beta Blockers:Not Applicable 

## 2013-12-20 NOTE — Op Note (Signed)
DATE OF OPERATION: 12/20/2013 Surgeon: Ruby Cola Procedure Performed: [phone] bilateral tonsillectomy > 50 years old  PREOPERATIVE DIAGNOSIS: tonsillar hypertrophy with acute on chronic tonsillitis with history of right peritonsillar abscess  POSTOPERATIVE DIAGNOSIS: tonsillar hypertrophy with acute on chronic tonsillitis with history of right peritonsillar abscess SURGEON: Ruby Cola ANESTHESIA: General endotracheal.  ESTIMATED BLOOD LOSS: less than 10 mL.  DRAINS: none SPECIMENS: right and left tonsil INDICATIONS: The patient is a 48yo with a history of tonsillar hypertrophy with acute on chronic tonsillitis with history of right peritonsillar abscess  DESCRIPTION OF OPERATION: The patient was brought to the operating room and was placed in the supine position and was placed under general endotracheal anesthesia by anesthesiology. The bed was turned 90 degrees and the Crowe-Davis mouth retractor was placed over the endotracheal tube and suspended from the Mayo stand. The palate was inspected and palpated and noted to be intact with no submucous cleft. The uvula was midline and normal.  Next the right tonsil was grasped with a curved Allis clamp and dissected from the right tonsillar fossa using the Bovie. Meticulous hemostasis was then achieved. The left tonsil was then grasped with the curved Allis and dissected from the left tonsillar fossa using the Bovie. Meticulous hemostasis was achieved. The nasal cavity and oropharynx were irrigated out and then the the nose, oral cavity,  and stomach were suctioned out. The patient was turned back to anesthesia and awakened from anesthesia and extubated without difficulty. The patient tolerated the procedure well with no immediate complications and was taken to the postoperative recovery area in good condition.   Dr. Ruby Cola was present and performed the entire procedure. 12/20/2013  8:07 AM Ruby Cola

## 2013-12-20 NOTE — Addendum Note (Signed)
Addendum created 12/20/13 0957 by Jenne Campus, CRNA   Modules edited: Anesthesia Flowsheet

## 2013-12-20 NOTE — Discharge Summary (Signed)
12/20/2013  11:14 PM  Date of Admission:12/20/2013 Date of Discharge:12/20/2013  Discharge [id number], Alroy Dust, MD  Admitting [id number], Alroy Dust, MD  Reason for admission/final discharge diagnosis: tonsillar hypertrophy/chronic tonsillitis  Labs:none  Procedure(s) performed:tonsillectomy  Discharge Condition:good  Discharge Exam:tonsils surgically absent, no bleeding, CN 2-12 intact  Discharge Instructions: No heavy lifting, follow up with  Dr. Simeon Craft At [facility] in 3-4 weeks, post-tonsillectomy diet  Hospital Course: had tonsillectomy, discharged from PACU, not admitted to hospital  Ruby Cola 11:14 PM 12/20/2013

## 2013-12-20 NOTE — Discharge Instructions (Signed)
Post-tonsillectomy diet with plenty of PO fluids. Rx for hydrocodone is on patient's chart, Rx for antibiotic and Zofran were sent to pharmacy. No strenuous activity for 2 weeks. Follow up with Dr. Simeon Craft in 3-4 weeks.

## 2013-12-20 NOTE — Transfer of Care (Signed)
Immediate Anesthesia Transfer of Care Note  Patient: Mallory Shaw  Procedure(s) Performed: Procedure(s): TONSILLECTOMY (Bilateral)  Patient Location: PACU  Anesthesia Type:General  Level of Consciousness: awake, alert , oriented and patient cooperative  Airway & Oxygen Therapy: Patient Spontanous Breathing and Patient connected to nasal cannula oxygen  Post-op Assessment: Report given to PACU RN and Post -op Vital signs reviewed and stable  Post vital signs: Reviewed  Complications: No apparent anesthesia complications

## 2013-12-20 NOTE — Progress Notes (Signed)
May give additional dilaudid if needed per Dr Tamala Julian

## 2013-12-20 NOTE — Anesthesia Postprocedure Evaluation (Signed)
  Anesthesia Post-op Note  Patient: Mallory Shaw  Procedure(s) Performed: Procedure(s): TONSILLECTOMY (Bilateral)  Patient Location: PACU  Anesthesia Type:General  Level of Consciousness: awake, alert , oriented and patient cooperative  Airway and Oxygen Therapy: Patient Spontanous Breathing  Post-op Pain: mild  Post-op Assessment: Post-op Vital signs reviewed, Patient's Cardiovascular Status Stable, Respiratory Function Stable, Patent Airway, No signs of Nausea or vomiting and Pain level controlled  Post-op Vital Signs: stable  Complications: No apparent anesthesia complications

## 2013-12-20 NOTE — Progress Notes (Signed)
Report given to rebecca rn as caregiver

## 2013-12-20 NOTE — Anesthesia Procedure Notes (Signed)

## 2013-12-20 NOTE — Anesthesia Preprocedure Evaluation (Addendum)
Anesthesia Evaluation  Patient identified by MRN, date of birth, ID band Patient awake    Reviewed: Allergy & Precautions, H&P , NPO status , Patient's Chart, lab work & pertinent test results  History of Anesthesia Complications Negative for: history of anesthetic complications  Airway Mallampati: II TM Distance: >3 FB Neck ROM: Full    Dental  (+) Partial Lower, Teeth Intact and Dental Advisory Given   Pulmonary Current Smoker,  breath sounds clear to auscultation        Cardiovascular negative cardio ROS  Rhythm:Regular Rate:Normal     Neuro/Psych  Headaches,    GI/Hepatic negative GI ROS, (+)     substance abuse  marijuana use,   Endo/Other  negative endocrine ROS  Renal/GU negative Renal ROS     Musculoskeletal   Abdominal   Peds  Hematology   Anesthesia Other Findings   Reproductive/Obstetrics negative OB ROS                         Anesthesia Physical Anesthesia Plan  ASA: II  Anesthesia Plan: General   Post-op Pain Management:    Induction: Intravenous  Airway Management Planned: Oral ETT  Additional Equipment:   Intra-op Plan:   Post-operative Plan: Extubation in OR  Informed Consent: I have reviewed the patients History and Physical, chart, labs and discussed the procedure including the risks, benefits and alternatives for the proposed anesthesia with the patient or authorized representative who has indicated his/her understanding and acceptance.     Plan Discussed with:   Anesthesia Plan Comments:         Anesthesia Quick Evaluation

## 2013-12-21 ENCOUNTER — Encounter (HOSPITAL_COMMUNITY): Payer: Self-pay | Admitting: Emergency Medicine

## 2013-12-21 ENCOUNTER — Emergency Department (HOSPITAL_COMMUNITY)
Admission: EM | Admit: 2013-12-21 | Discharge: 2013-12-21 | Disposition: A | Discharge status: Home / Self Care | Payer: BC Managed Care – PPO | Attending: Emergency Medicine | Admitting: Emergency Medicine

## 2013-12-21 DIAGNOSIS — T888XXA Other specified complications of surgical and medical care, not elsewhere classified, initial encounter: Secondary | ICD-10-CM

## 2013-12-21 DIAGNOSIS — Z792 Long term (current) use of antibiotics: Secondary | ICD-10-CM | POA: Insufficient documentation

## 2013-12-21 DIAGNOSIS — R0609 Other forms of dyspnea: Secondary | ICD-10-CM | POA: Insufficient documentation

## 2013-12-21 DIAGNOSIS — Y838 Other surgical procedures as the cause of abnormal reaction of the patient, or of later complication, without mention of misadventure at the time of the procedure: Secondary | ICD-10-CM | POA: Insufficient documentation

## 2013-12-21 DIAGNOSIS — Z8659 Personal history of other mental and behavioral disorders: Secondary | ICD-10-CM | POA: Insufficient documentation

## 2013-12-21 DIAGNOSIS — Z8542 Personal history of malignant neoplasm of other parts of uterus: Secondary | ICD-10-CM | POA: Insufficient documentation

## 2013-12-21 DIAGNOSIS — R0989 Other specified symptoms and signs involving the circulatory and respiratory systems: Secondary | ICD-10-CM | POA: Insufficient documentation

## 2013-12-21 DIAGNOSIS — IMO0002 Reserved for concepts with insufficient information to code with codable children: Secondary | ICD-10-CM | POA: Insufficient documentation

## 2013-12-21 DIAGNOSIS — R040 Epistaxis: Secondary | ICD-10-CM | POA: Insufficient documentation

## 2013-12-21 DIAGNOSIS — F17200 Nicotine dependence, unspecified, uncomplicated: Secondary | ICD-10-CM | POA: Insufficient documentation

## 2013-12-21 NOTE — ED Provider Notes (Signed)
CSN: [id number]     Arrival date & time 12/21/13  2019 History   First MD Initiated Contact with Patient 12/21/13 2039     Chief Complaint  Patient presents with  . Dysphagia   (Consider location/radiation/quality/duration/timing/severity/associated sxs/prior Treatment) The history is provided by the patient.   Patient here with throat pain with bleeding that has since subsided. She had tonsillectomy done yesterday. She coughed and had some bleeding down her throat which is since resolved. She also had transient nosebleed as well 2. She's able to drink ice at this time without trouble. She notes subjective trouble breathing but she has no signs of stridor. Used her pain meds at home without relief. Past Medical History  Diagnosis Date  . Substance abuse   . Uterine cancer   . Degenerative disk disease   . Depression   . Degenerative disc disease   . ZSWFUXNA(355.7)    Past Surgical History  Procedure Laterality Date  . Partial hysterectomy    . Tubal ligation    . Breast lumpectomy      Hx: of right breast  . Tonsillectomy     Family History  Problem Relation Age of Onset  . Hypertension Mother    History  Substance Use Topics  . Smoking status: Current Some Day Smoker -- 0.25 packs/day for 20 years    Types: Cigarettes  . Smokeless tobacco: Never Used  . Alcohol Use: Yes     Comment: Occasional   OB History   Grav Para Term Preterm Abortions TAB SAB Ect Mult Living                 Review of Systems  All other systems reviewed and are negative.    Allergies  Tramadol  Home Medications   Current Outpatient Rx  Name  Route  Sig  Dispense  Refill  . acetaminophen (TYLENOL) 500 MG tablet   Oral   Take 500 mg by mouth every 6 (six) hours as needed for pain.         . cephALEXin (KEFLEX) 500 MG capsule   Oral   Take 500 mg by mouth 3 (three) times daily.         Marland Kitchen HYDROcodone-acetaminophen (NORCO) 7.5-325 MG per tablet   Oral   Take 1 tablet by mouth  every 6 (six) hours as needed for moderate pain.         Marland Kitchen HYDROcodone-acetaminophen (NORCO/VICODIN) 5-325 MG per tablet   Oral   Take 1 tablet by mouth every 4 (four) hours as needed for pain.   10 tablet   0   . ondansetron (ZOFRAN-ODT) 8 MG disintegrating tablet   Oral   Take 8 mg by mouth every 8 (eight) hours as needed for nausea or vomiting.          BP 126/93  Pulse 66  Temp(Src) 97.4 F (36.3 C) (Oral)  Resp 18  Wt 108 lb (48.988 kg)  SpO2 98% Physical Exam  Nursing note and vitals reviewed. Constitutional: She is oriented to person, place, and time. She appears well-developed and well-nourished.  Non-toxic appearance. No distress.  HENT:  Head: Normocephalic and atraumatic.  Cauterized  region noted throughout. Tonsillar pillars. No active bleeding noted. The patient's uvula slightly erythematous and swollen without deviation. No evidence of airway obstruction  Eyes: Conjunctivae, EOM and lids are normal. Pupils are equal, round, and reactive to light.  Neck: Normal range of motion. Neck supple. No tracheal deviation present. No mass present.  Cardiovascular: Normal rate, regular rhythm and normal heart sounds.  Exam reveals no gallop.   No murmur heard. Pulmonary/Chest: Effort normal and breath sounds normal. No stridor. No respiratory distress. She has no decreased breath sounds. She has no wheezes. She has no rhonchi. She has no rales.  Abdominal: Soft. Normal appearance and bowel sounds are normal. She exhibits no distension. There is no tenderness. There is no rebound and no CVA tenderness.  Musculoskeletal: Normal range of motion. She exhibits no edema and no tenderness.  Neurological: She is alert and oriented to person, place, and time. She has normal strength. No cranial nerve deficit or sensory deficit. GCS eye subscore is 4. GCS verbal subscore is 5. GCS motor subscore is 6.  Skin: Skin is warm and dry. No abrasion and no rash noted.  Psychiatric: She has a  normal mood and affect. Her speech is normal and behavior is normal.    ED Course  Procedures (including critical care time) Labs Review Labs Reviewed - No data to display Imaging Review No results found.  EKG Interpretation   None       MDM  No diagnosis found. D/w dr. Redmond Baseman, pt without signs of airway compromise. No stridor appreciated. No active bleeding noted. Patient given instructions to gargle with cold water if the bleeding returns and will followup with her ENT doctor next week    Leota Jacobsen, MD 12/21/13 2129

## 2013-12-21 NOTE — Discharge Instructions (Signed)
Tonsillectomy, Adult, Care After  Refer to this sheet in the next few weeks. These instructions provide you with information on caring for yourself after your procedure. Your health care provider may also give you more specific instructions. Your treatment has been planned according to current medical practices, but problems sometimes occur. Call your health care provider if you have any problems or questions after your procedure.  WHAT TO EXPECT AFTER THE PROCEDURE  After your procedure, it is typical to have the following:  · Your tongue will be numb, and your sense of taste will be reduced.  · Your swallowing will be difficult and painful.  · Your jaw may hurt or make a clicking noise when you yawn or chew.  · Liquids that you drink may leak out of your nose.  · Your voice may sound muffled.  · The area at the middle of the roof of your mouth (uvula) may be very swollen.  · You may have a constant cough and need to clear mucus and phlegm from your throat.  HOME CARE INSTRUCTIONS   · Get proper rest, keeping your head elevated at all times.  · Drink plenty of fluids. This reduces pain and hastens the healing process.  · Only take over-the-counter or prescription medicines for pain, discomfort, or fever as directed by your health care provider. Do not take aspirin or nonsteroidal anti-inflammatory drugs, such as ibuprofen, for 2 weeks after surgery. These medicines increase the possibility of bleeding.  · Soft and cold foods, such as gelatin, sherbet, ice cream, frozen ice pops, and cold drinks, are usually the easiest to eat. Several days after surgery, you will be able to eat more solid food.  · Avoid mouthwashes and gargles.  · Avoid contact with people who have upper respiratory infections, such as colds and sore throats.  SEEK MEDICAL CARE IF:   · You have increasing pain that is not controlled with medicines.  · You have a fever.  · You have a rash.  · You feel lightheaded or faint.  · You are unable to  swallow even small amounts of liquid or saliva.  · Your urine is becoming very dark.  SEEK IMMEDIATE MEDICAL CARE IF:   · You have difficulty breathing.  · You experience side effects or allergic reactions to medicines.  · You bleed bright red blood from your throat, or you vomit bright red blood.  Document Released: 09/28/2004 Document Revised: 09/18/2013 Document Reviewed: 06/25/2013  [employer]® Patient Information ©2014 [employer].

## 2013-12-21 NOTE — ED Notes (Signed)
Pt c/o pain and difficulty swallowing. Pt 1 day post op tonsillectomy at this facility. Pt reports feeling bleeding drip down throat. Pt reports nosebleed last night and pain not controlled by meds.

## 2013-12-22 ENCOUNTER — Emergency Department (HOSPITAL_COMMUNITY)
Admission: EM | Admit: 2013-12-22 | Discharge: 2013-12-22 | Disposition: A | Discharge status: Home / Self Care | Payer: BC Managed Care – PPO | Attending: Emergency Medicine | Admitting: Emergency Medicine

## 2013-12-22 ENCOUNTER — Encounter (HOSPITAL_COMMUNITY): Payer: Self-pay | Admitting: Emergency Medicine

## 2013-12-22 DIAGNOSIS — Z8542 Personal history of malignant neoplasm of other parts of uterus: Secondary | ICD-10-CM | POA: Insufficient documentation

## 2013-12-22 DIAGNOSIS — G8918 Other acute postprocedural pain: Secondary | ICD-10-CM | POA: Insufficient documentation

## 2013-12-22 DIAGNOSIS — J029 Acute pharyngitis, unspecified: Secondary | ICD-10-CM | POA: Insufficient documentation

## 2013-12-22 DIAGNOSIS — R110 Nausea: Secondary | ICD-10-CM | POA: Insufficient documentation

## 2013-12-22 DIAGNOSIS — Z8739 Personal history of other diseases of the musculoskeletal system and connective tissue: Secondary | ICD-10-CM | POA: Insufficient documentation

## 2013-12-22 DIAGNOSIS — Z9089 Acquired absence of other organs: Secondary | ICD-10-CM | POA: Insufficient documentation

## 2013-12-22 DIAGNOSIS — Z792 Long term (current) use of antibiotics: Secondary | ICD-10-CM | POA: Insufficient documentation

## 2013-12-22 DIAGNOSIS — F17200 Nicotine dependence, unspecified, uncomplicated: Secondary | ICD-10-CM | POA: Insufficient documentation

## 2013-12-22 DIAGNOSIS — Z9889 Other specified postprocedural states: Secondary | ICD-10-CM | POA: Insufficient documentation

## 2013-12-22 DIAGNOSIS — Z8659 Personal history of other mental and behavioral disorders: Secondary | ICD-10-CM | POA: Insufficient documentation

## 2013-12-22 MED ORDER — HYDROCODONE-ACETAMINOPHEN 7.5-325 MG/15ML PO SOLN: 10.0000 mL | ORAL | Status: DC | PRN

## 2013-12-22 MED ORDER — MORPHINE SULFATE 4 MG/ML IJ SOLN
6.0000 mg | Freq: Once | INTRAMUSCULAR | Status: AC
  Administered 2013-12-22: 6 mg via INTRAVENOUS
  Filled 2013-12-22: qty 2

## 2013-12-22 MED ORDER — DEXAMETHASONE SODIUM PHOSPHATE 10 MG/ML IJ SOLN
10.0000 mg | Freq: Once | INTRAMUSCULAR | Status: AC
  Administered 2013-12-22: 10 mg via INTRAVENOUS
  Filled 2013-12-22: qty 1

## 2013-12-22 MED ORDER — HYDROCODONE-ACETAMINOPHEN 7.5-325 MG PO TABS: ORAL_TABLET | ORAL | Status: DC

## 2013-12-22 MED ORDER — MORPHINE SULFATE 4 MG/ML IJ SOLN
4.0000 mg | Freq: Once | INTRAMUSCULAR | Status: AC
  Administered 2013-12-22: 4 mg via INTRAVENOUS
  Filled 2013-12-22: qty 1

## 2013-12-22 MED ORDER — ONDANSETRON HCL 4 MG/2ML IJ SOLN
4.0000 mg | Freq: Once | INTRAMUSCULAR | Status: AC
  Administered 2013-12-22: 4 mg via INTRAVENOUS
  Filled 2013-12-22: qty 2

## 2013-12-22 MED ORDER — KETOROLAC TROMETHAMINE 30 MG/ML IJ SOLN
30.0000 mg | Freq: Once | INTRAMUSCULAR | Status: AC
  Administered 2013-12-22: 30 mg via INTRAVENOUS
  Filled 2013-12-22: qty 1

## 2013-12-22 MED ORDER — SODIUM CHLORIDE 0.9 % IV BOLUS (SEPSIS)
1000.0000 mL | INTRAVENOUS | Status: AC
  Administered 2013-12-22: 1000 mL via INTRAVENOUS

## 2013-12-22 MED ORDER — HYDROCODONE-ACETAMINOPHEN 7.5-325 MG/15ML PO SOLN
10.0000 mL | Freq: Once | ORAL | Status: AC
  Administered 2013-12-22: 10 mL via ORAL
  Filled 2013-12-22: qty 15

## 2013-12-22 NOTE — Discharge Instructions (Signed)
You may take liquid Lortab as needed for pain.  Do not take Vicodin/norco at same time while taking Lortab.  Be sure to follow up with Dr. Simeon Craft next week for continued throat pain. Return to ER if difficulty breathing, unable to keep down fluids, or other new concerning symptoms arise.

## 2013-12-22 NOTE — ED Provider Notes (Signed)
CSN: [id number]     Arrival date & time 12/22/13  1520 History   First MD Initiated Contact with Patient 12/22/13 1602     Chief Complaint  Patient presents with  . Post-op Problem  . Fever  . Sore Throat   (Consider location/radiation/quality/duration/timing/severity/associated sxs/prior Treatment) HPI Pt is a 48yo female s/p tonsillectomy on 12/20/13 c/o worsening throat pain and swelling associated with nausea. Pt was seen for same yesterday in ED, Dr. Redmond Baseman who was on-call for ENT recommended gargling with cold water if bleeding return and to f/u with ENT next week.  Today, pt states she was able to keep down ice yesterday but is unable to take liquid medication, ice or cold water. States when she takes he medication it comes back out through her nose. Reports nausea without vomiting. Pt states pain is constant, 10/10. Also states it feels like she has more swelling in her throat. Denies difficulty breathing and is still able to speak but painful.  Reports fever of 101 at home.    ENT: Dr. Simeon Craft  Past Medical History  Diagnosis Date  . Substance abuse   . Uterine cancer   . Degenerative disk disease   . Depression   . Degenerative disc disease   . BMWUXLKG(401.0)    Past Surgical History  Procedure Laterality Date  . Partial hysterectomy    . Tubal ligation    . Breast lumpectomy      Hx: of right breast  . Tonsillectomy     Family History  Problem Relation Age of Onset  . Hypertension Mother    History  Substance Use Topics  . Smoking status: Current Some Day Smoker -- 0.25 packs/day for 20 years    Types: Cigarettes  . Smokeless tobacco: Never Used  . Alcohol Use: Yes     Comment: Occasional   OB History   Grav Para Term Preterm Abortions TAB SAB Ect Mult Living                 Review of Systems  Constitutional: Positive for fever and chills.  HENT: Positive for sore throat and trouble swallowing. Negative for congestion and voice change.   Respiratory: Negative  for wheezing and stridor.   Cardiovascular: Negative for chest pain.  Gastrointestinal: Positive for nausea. Negative for vomiting, abdominal pain and diarrhea.  Musculoskeletal: Negative for myalgias, neck pain and neck stiffness.  Neurological: Negative for headaches.  All other systems reviewed and are negative.    Allergies  Tramadol  Home Medications   Current Outpatient Rx  Name  Route  Sig  Dispense  Refill  . acetaminophen (TYLENOL) 500 MG tablet   Oral   Take 500 mg by mouth every 6 (six) hours as needed for pain.         . cephALEXin (KEFLEX) 500 MG capsule   Oral   Take 500 mg by mouth 3 (three) times daily.         . ondansetron (ZOFRAN-ODT) 8 MG disintegrating tablet   Oral   Take 8 mg by mouth every 8 (eight) hours as needed for nausea or vomiting.         Marland Kitchen HYDROcodone-acetaminophen (NORCO) 7.5-325 MG per tablet      Take 1-2 pills every 4-6 hours as needed for pain.   15 tablet   0    BP 121/65  Pulse 82  Temp(Src) 97.7 F (36.5 C) (Oral)  Resp 18  SpO2 97% Physical Exam  Nursing note and  vitals reviewed. Constitutional: She appears well-developed and well-nourished. No distress.  Pt lying in exam bed, appears uncomfortable.   HENT:  Head: Normocephalic and atraumatic.  Nose: Nose normal.  Mouth/Throat: Uvula is midline. No trismus in the jaw. Uvula swelling present. Posterior oropharyngeal edema and posterior oropharyngeal erythema present. No oropharyngeal exudate or tonsillar abscesses.  Oropharyngeal erythema with mild edema of uvula w/o deviation. Cauterized area in posterior pharynx noted. No active bleeding.   Eyes: Conjunctivae are normal. No scleral icterus.  Neck: Normal range of motion. Neck supple. No JVD present. No tracheal deviation present. No thyromegaly present.  Able to speak in full sentences, no stridor.   Cardiovascular: Normal rate, regular rhythm and normal heart sounds.   Pulmonary/Chest: Effort normal and breath  sounds normal. No stridor. No respiratory distress. She has no wheezes. She has no rales. She exhibits no tenderness.  Abdominal: Soft. Bowel sounds are normal. She exhibits no distension. There is no tenderness.  Musculoskeletal: Normal range of motion.  Lymphadenopathy:    She has no cervical adenopathy.  Neurological: She is alert.  Skin: Skin is warm and dry. She is not diaphoretic.    ED Course  Procedures (including critical care time) Labs Review Labs Reviewed - No data to display Imaging Review No results found.  EKG Interpretation   None       MDM   1. Post-operative pain    Pt c/o worsening throat pain and swelling, difficulty swallowing her liquid medications and cannot even swallow melted ice like she was able to yesterday.  Reports fever of 101 at home and nausea. Denies difficulty breathing or change in voice, however, does report pain with speaking.   Tx in ED: morphine IV, toradol, decadron, and zofran given in ED.  Pt reported pain decreased from 10/10 to 8/10, however still having difficulty swallowing liquids.    Discussed pt with Dr. Dina Rich, 4mg  morphine given along with liquid Lortab.  Pt stated after liquid Lortab, throat was numbed immediately and pt has been able to keep down ice chips and several ounces of water.  Pt states she feels comfortable being discharged home with ENT f/u next week as needed.     9:10 PM was called by [place] off [address] [employer] stating pt was attempting to fill Norco and was requesting liquid form instead. Pt initially stated she could not afford liquid medication and requested pill form.  Pharmacy stated she did have 486mL Lortab filled on 12/20/13 and paid for medication at that time.  Advised pharmacy not to fill Norco and will not change to Lortab as pt will need to f/u with ENT this week for further pain medication.   Noland Fordyce, PA-C 12/22/13 2130

## 2013-12-22 NOTE — ED Notes (Signed)
Pt attempted fluid challenge but was unable to keep fluids in her mouth or swallow them

## 2013-12-22 NOTE — ED Notes (Signed)
Pt was here last night for same sx.  States that she had a tonsillectomy on Friday.  States that she has a fever of 101 but her throat is so sore that she does not want to swallow tylenol.

## 2013-12-23 ENCOUNTER — Encounter (HOSPITAL_COMMUNITY): Payer: Self-pay | Admitting: Otolaryngology

## 2013-12-23 NOTE — ED Provider Notes (Signed)
Medical screening examination/treatment/procedure(s) were conducted as a shared visit with non-physician practitioner(s) and myself.  I personally evaluated the patient during the encounter.  EKG Interpretation   None       Patient presents for dysphagia following tonsillectomy.  No stridor, dysphonia or significant swelling on exam.  States can't keep antibiotics or pain meds down.  NS bolus and decadron given. Patient given IV pain meds and then transitioned to PO hydrocodone without difficulty.  Reports improved pain and able to tolerate po.  There was some discrepancy at d/c regarding additional pain medication.  Patient states she cannot afford the liquid hydrocodone and would like pills that can be crushed.  She was provided Rx.  However, received call from pharmacy with patient stating that she wanted the liquid.  Patient had ~ 400cc of hydrocodone liquid dispensed two days ago.  At this time, patient instructed to follow-up with ENT for further Rx and Rx cancelled.  After history, exam, and medical workup I feel the patient has been appropriately medically screened and is safe for discharge home. Pertinent diagnoses were discussed with the patient. Patient was given return precautions.   Merryl Hacker, MD 12/23/13 1800

## 2013-12-24 ENCOUNTER — Emergency Department (HOSPITAL_COMMUNITY)
Admission: EM | Admit: 2013-12-24 | Discharge: 2013-12-24 | Disposition: A | Discharge status: Home / Self Care | Payer: BC Managed Care – PPO | Attending: Emergency Medicine | Admitting: Emergency Medicine

## 2013-12-24 DIAGNOSIS — F17200 Nicotine dependence, unspecified, uncomplicated: Secondary | ICD-10-CM | POA: Insufficient documentation

## 2013-12-24 DIAGNOSIS — Z8659 Personal history of other mental and behavioral disorders: Secondary | ICD-10-CM | POA: Insufficient documentation

## 2013-12-24 DIAGNOSIS — IMO0002 Reserved for concepts with insufficient information to code with codable children: Secondary | ICD-10-CM | POA: Insufficient documentation

## 2013-12-24 DIAGNOSIS — G8918 Other acute postprocedural pain: Secondary | ICD-10-CM

## 2013-12-24 DIAGNOSIS — Z8544 Personal history of malignant neoplasm of other female genital organs: Secondary | ICD-10-CM | POA: Insufficient documentation

## 2013-12-24 DIAGNOSIS — J95830 Postprocedural hemorrhage and hematoma of a respiratory system organ or structure following a respiratory system procedure: Secondary | ICD-10-CM

## 2013-12-24 DIAGNOSIS — Y836 Removal of other organ (partial) (total) as the cause of abnormal reaction of the patient, or of later complication, without mention of misadventure at the time of the procedure: Secondary | ICD-10-CM | POA: Insufficient documentation

## 2013-12-24 DIAGNOSIS — Z9089 Acquired absence of other organs: Secondary | ICD-10-CM

## 2013-12-24 MED ORDER — HYDROMORPHONE HCL PF 1 MG/ML IJ SOLN
1.0000 mg | Freq: Once | INTRAMUSCULAR | Status: AC
  Administered 2013-12-24: 1 mg via INTRAVENOUS
  Filled 2013-12-24: qty 1

## 2013-12-24 MED ORDER — LORAZEPAM 2 MG/ML IJ SOLN
0.5000 mg | Freq: Once | INTRAMUSCULAR | Status: AC
  Administered 2013-12-24: 0.5 mg via INTRAVENOUS
  Filled 2013-12-24: qty 1

## 2013-12-24 MED ORDER — SODIUM CHLORIDE 0.9 % IV BOLUS (SEPSIS)
1000.0000 mL | Freq: Once | INTRAVENOUS | Status: AC
  Administered 2013-12-24: 1000 mL via INTRAVENOUS

## 2013-12-24 MED ORDER — HYDROMORPHONE HCL PF 1 MG/ML IJ SOLN
0.5000 mg | Freq: Once | INTRAMUSCULAR | Status: AC
  Administered 2013-12-24: 0.5 mg via INTRAVENOUS
  Filled 2013-12-24: qty 1

## 2013-12-24 MED ORDER — ONDANSETRON HCL 4 MG/2ML IJ SOLN
4.0000 mg | Freq: Once | INTRAMUSCULAR | Status: AC
  Administered 2013-12-24: 4 mg via INTRAVENOUS
  Filled 2013-12-24: qty 2

## 2013-12-24 NOTE — ED Notes (Signed)
Bed: WA08 Expected date:  Expected time:  Means of arrival:  Comments: EMS 

## 2013-12-24 NOTE — Discharge Instructions (Signed)
Follow up with your ENT doctor tomorrow - call their office tomorrow morning to arrange that follow up appointment. Try drinking cool/cold liquids, icewater, etc. May supplement nutrition w shakes/Ensure shakes, soups, soft foods. Return to [facility] ED if worse, new symptoms, recurrent or persistent bleeding, fevers, trouble breathing, unable to swallow, other concern.   You were given pain medication in the ER that causes drowsiness - no driving for the next 6 hours.       Tonsillectomy, Adult A tonsillectomy is a surgery to remove your tonsils. Tonsils are lymph tissues at the back and upper part of your throat. Because tonsils can collect debris, they can become infected. Tonsillectomy often is done when nonsurgical treatments have been unsuccessful in resolving problems with tonsils.  LET [facility] CARE PROVIDER KNOW ABOUT:  Any allergies you have.  All medicines you are taking, including vitamins, herbs, eye drops, creams, and over-the-counter medicines, especially those containing aspirin or ibuprofen.  Previous problems you or members of your family have had with the use of anesthetics.  Any blood disorders you have.  Previous surgeries you have had.  Medical conditions you have.  Recent cough or fever you have had. RISKS AND COMPLICATIONS Generally, tonsillectomy is a safe procedure. However, as with any procedure, complications can occur. Possible complications include:  Bleeding.  Infection.  Scarring.  Changes in your sense of taste.  Changes in your voice.  Changes in swallowing. BEFORE THE PROCEDURE  Do not take any aspirin, ibuprofen, or any medicines that may contain these agents for 2 weeks before the procedure.  Do not eat or drink after midnight the night before the procedure. PROCEDURE   You will be given a medicine that makes you go to sleep (general anesthetic).  A device will be placed inside of your mouth that presses your tongue down so  that the tonsils at the back of your throat can be removed without cuts on the outside of your neck or throat.  Your tonsils will typically be removed with a device called an electrocautery, which will cut your tonsils out and then shrink the surrounding blood vessels at the same time so that you will not bleed after the procedure.  Usually, stitches will not be used to close the cut. A white scab (eschar) will form in the area where your tonsils used to be. AFTER THE PROCEDURE After surgery, you will be taken to a recovery area for close monitoring. Once you are awake, stable, and taking fluids well, you will be allowed to go home.  Document Released: 03/12/2001 Document Revised: 09/18/2013 Document Reviewed: 06/25/2013 [facility] Patient Information 2014 [place], [place].    Tonsillectomy, Adult, Care After Refer to this sheet in the next few weeks. These instructions provide you with information on caring for yourself after your procedure. Your health care provider may also give you more specific instructions. Your treatment has been planned according to current medical practices, but problems sometimes occur. Call your health care provider if you have any problems or questions after your procedure. WHAT TO EXPECT AFTER THE PROCEDURE After your procedure, it is typical to have the following:  Your tongue will be numb, and your sense of taste will be reduced.  Your swallowing will be difficult and painful.  Your jaw may hurt or make a clicking noise when you yawn or chew.  Liquids that you drink may leak out of your nose.  Your voice may sound muffled.  The area at the middle of the  roof of your mouth (uvula) may be very swollen.  You may have a constant cough and need to clear mucus and phlegm from your throat. HOME CARE INSTRUCTIONS   Get proper rest, keeping your head elevated at all times.  Drink plenty of fluids. This reduces pain and hastens the healing process.  Only take  over-the-counter or prescription medicines for pain, discomfort, or fever as directed by your health care provider. Do not take aspirin or nonsteroidal anti-inflammatory drugs, such as ibuprofen, for 2 weeks after surgery. These medicines increase the possibility of bleeding.  Soft and cold foods, such as gelatin, sherbet, ice cream, frozen ice pops, and cold drinks, are usually the easiest to eat. Several days after surgery, you will be able to eat more solid food.  Avoid mouthwashes and gargles.  Avoid contact with people who have upper respiratory infections, such as colds and sore throats. SEEK MEDICAL CARE IF:   You have increasing pain that is not controlled with medicines.  You have a fever.  You have a rash.  You feel lightheaded or faint.  You are unable to swallow even small amounts of liquid or saliva.  Your urine is becoming very dark. SEEK IMMEDIATE MEDICAL CARE IF:   You have difficulty breathing.  You experience side effects or allergic reactions to medicines.  You bleed bright red blood from your throat, or you vomit bright red blood. Document Released: 09/28/2004 Document Revised: 09/18/2013 Document Reviewed: 06/25/2013 [facility] Patient Information 2014 [place], [place].

## 2013-12-24 NOTE — ED Provider Notes (Signed)
CSN: [id number]     Arrival date & time 12/24/13  26 History   First MD Initiated Contact with Patient 12/24/13 1728     Chief Complaint  Patient presents with  . Post-op Problem   (Consider location/radiation/quality/duration/timing/severity/associated sxs/prior Treatment) The history is provided by the patient.  pt 4 days post op from tonsillectomy.  States post op persistent problems w pain/painful swallowing, prior ed visit for same. Today states after coughing spell noted large clot in mouth and felt she was going to choke on it, got scared, called ems. Prior to today, denies any post op bleeding. No anticoag use, no other abn bleeding or bruising. Bleeding minor, now improved. No faintness or dizziness. No trouble breathing. No sob. No fever or chills.      Past Medical History  Diagnosis Date  . Substance abuse   . Uterine cancer   . Degenerative disk disease   . Depression   . Degenerative disc disease   . ELFYBOFB(510.2)    Past Surgical History  Procedure Laterality Date  . Partial hysterectomy    . Tubal ligation    . Breast lumpectomy      Hx: of right breast  . Tonsillectomy    . Tonsillectomy and adenoidectomy Bilateral 12/20/2013    Procedure: TONSILLECTOMY;  Surgeon: Ruby Cola, MD;  Location: [facility] OR;  Service: ENT;  Laterality: Bilateral;   Family History  Problem Relation Age of Onset  . Hypertension Mother    History  Substance Use Topics  . Smoking status: Current Some Day Smoker -- 0.25 packs/day for 20 years    Types: Cigarettes  . Smokeless tobacco: Never Used  . Alcohol Use: Yes     Comment: Occasional   OB History   Grav Para Term Preterm Abortions TAB SAB Ect Mult Living                 Review of Systems  Constitutional: Negative for fever and chills.  HENT:       Painful swallowing post recent procedure.   Eyes: Negative for redness.  Respiratory: Negative for shortness of breath and stridor.   Cardiovascular: Negative for chest  pain.  Gastrointestinal: Negative for vomiting and abdominal pain.  Genitourinary: Negative for flank pain.  Musculoskeletal: Negative for back pain and neck pain.  Skin: Negative for rash.  Neurological: Negative for light-headedness.  Hematological: Does not bruise/bleed easily.  Psychiatric/Behavioral: Negative for confusion.    Allergies  Tramadol  Home Medications   Current Outpatient Rx  Name  Route  Sig  Dispense  Refill  . acetaminophen (TYLENOL) 500 MG tablet   Oral   Take 500 mg by mouth every 6 (six) hours as needed for pain.         . cephALEXin (KEFLEX) 500 MG capsule   Oral   Take 500 mg by mouth 3 (three) times daily.         Marland Kitchen HYDROcodone-acetaminophen (NORCO) 7.5-325 MG per tablet      Take 1-2 pills every 4-6 hours as needed for pain.   15 tablet   0   . ondansetron (ZOFRAN-ODT) 8 MG disintegrating tablet   Oral   Take 8 mg by mouth every 8 (eight) hours as needed for nausea or vomiting.          BP 134/67  Pulse 80  Temp(Src) 97.9 F (36.6 C) (Oral)  Resp 18  SpO2 97% Physical Exam  Nursing note and vitals reviewed. Constitutional: She appears well-developed and  well-nourished. No distress.  HENT:  Post surgical changes noted to peritonsillar area. Blood/saliva coating surgical area, no definite active bleeding noted.   Eyes: Conjunctivae are normal. No scleral icterus.  Neck: Neck supple. No tracheal deviation present.  No neck mass/swelling.   Cardiovascular: Normal rate.   Pulmonary/Chest: Effort normal and breath sounds normal. No respiratory distress.  No stridor or increased wob.   Abdominal: Soft. Normal appearance. She exhibits no distension. There is no tenderness.  Musculoskeletal: She exhibits no edema and no tenderness.  Neurological: She is alert.  Skin: Skin is warm and dry. No rash noted.  Psychiatric:  anxious    ED Course  Procedures (including critical care time)   EKG Interpretation   None       MDM   Iv ns bolus (pt notes recent poor po intake due to painful swallowing post tonsillectomy).  Dilaudid 1 mg iv. zofran iv.  Small amount old blood in mouth, no definite active bleeding.  Pt gargling/drinking small amt ice water.   Reviewed nursing notes and prior charts for additional history.   Recheck pt, no bleeding.   Is able to swallow/ice water.   Additional iv ns. Pt also received ativan .5 mg iv to help w anxiety.  Pain controlled.   Discussed w pt - contact her ent surgeon tomorrow am. Return to ED if recurrent or persistent bleeding, unable to swallow or trouble breathing, fevers, etc.   Pt appears stable for d/c.       Mirna Mires, MD 12/24/13 1909

## 2013-12-24 NOTE — ED Notes (Signed)
Pt states that she had her tonsils out on Friday (4 days ago) and today started coughing up blood. States she coughed up a blood clot. Alert and oriented.

## 2015-09-11 ENCOUNTER — Emergency Department (HOSPITAL_COMMUNITY): Payer: Self-pay

## 2015-09-11 ENCOUNTER — Emergency Department (HOSPITAL_COMMUNITY)
Admission: EM | Admit: 2015-09-11 | Discharge: 2015-09-11 | Disposition: A | Discharge status: Home / Self Care | Payer: Self-pay | Attending: Emergency Medicine | Admitting: Emergency Medicine

## 2015-09-11 ENCOUNTER — Encounter (HOSPITAL_COMMUNITY): Payer: Self-pay

## 2015-09-11 DIAGNOSIS — Z720 Tobacco use: Secondary | ICD-10-CM | POA: Insufficient documentation

## 2015-09-11 DIAGNOSIS — Z8739 Personal history of other diseases of the musculoskeletal system and connective tissue: Secondary | ICD-10-CM | POA: Insufficient documentation

## 2015-09-11 DIAGNOSIS — N390 Urinary tract infection, site not specified: Secondary | ICD-10-CM | POA: Insufficient documentation

## 2015-09-11 DIAGNOSIS — Y9389 Activity, other specified: Secondary | ICD-10-CM | POA: Insufficient documentation

## 2015-09-11 DIAGNOSIS — Z8542 Personal history of malignant neoplasm of other parts of uterus: Secondary | ICD-10-CM | POA: Insufficient documentation

## 2015-09-11 DIAGNOSIS — R1030 Lower abdominal pain, unspecified: Secondary | ICD-10-CM

## 2015-09-11 DIAGNOSIS — Y998 Other external cause status: Secondary | ICD-10-CM | POA: Insufficient documentation

## 2015-09-11 DIAGNOSIS — S3991XA Unspecified injury of abdomen, initial encounter: Secondary | ICD-10-CM | POA: Insufficient documentation

## 2015-09-11 DIAGNOSIS — T7421XA Adult sexual abuse, confirmed, initial encounter: Secondary | ICD-10-CM | POA: Insufficient documentation

## 2015-09-11 DIAGNOSIS — F419 Anxiety disorder, unspecified: Secondary | ICD-10-CM | POA: Insufficient documentation

## 2015-09-11 DIAGNOSIS — Y9289 Other specified places as the place of occurrence of the external cause: Secondary | ICD-10-CM | POA: Insufficient documentation

## 2015-09-11 LAB — CBC WITH DIFFERENTIAL/PLATELET
Basophils Absolute: 0 10*3/uL (ref 0.0–0.1)
Basophils Relative: 0 %
EOS ABS: 0.1 10*3/uL (ref 0.0–0.7)
EOS PCT: 1 %
HCT: 35.8 % — ABNORMAL LOW (ref 36.0–46.0)
Hemoglobin: 12.1 g/dL (ref 12.0–15.0)
Lymphocytes Relative: 16 %
Lymphs Abs: 1.8 10*3/uL (ref 0.7–4.0)
MCH: 29.4 pg (ref 26.0–34.0)
MCHC: 33.8 g/dL (ref 30.0–36.0)
MCV: 87.1 fL (ref 78.0–100.0)
MONOS PCT: 6 %
Monocytes Absolute: 0.7 10*3/uL (ref 0.1–1.0)
Neutro Abs: 8.4 10*3/uL — ABNORMAL HIGH (ref 1.7–7.7)
Neutrophils Relative %: 77 %
PLATELETS: 371 10*3/uL (ref 150–400)
RBC: 4.11 MIL/uL (ref 3.87–5.11)
RDW: 13 % (ref 11.5–15.5)
WBC: 11.1 10*3/uL — ABNORMAL HIGH (ref 4.0–10.5)

## 2015-09-11 LAB — URINE MICROSCOPIC-ADD ON

## 2015-09-11 LAB — COMPREHENSIVE METABOLIC PANEL
ALT: 12 U/L — ABNORMAL LOW (ref 14–54)
ANION GAP: 10 (ref 5–15)
AST: 19 U/L (ref 15–41)
Albumin: 4.1 g/dL (ref 3.5–5.0)
Alkaline Phosphatase: 53 U/L (ref 38–126)
BUN: 16 mg/dL (ref 6–20)
CALCIUM: 8.9 mg/dL (ref 8.9–10.3)
CHLORIDE: 102 mmol/L (ref 101–111)
CO2: 24 mmol/L (ref 22–32)
Creatinine, Ser: 0.66 mg/dL (ref 0.44–1.00)
GFR calc non Af Amer: >60 mL/min (ref >60)
Glucose, Bld: 102 mg/dL — ABNORMAL HIGH (ref 65–99)
Potassium: 3.1 mmol/L — ABNORMAL LOW (ref 3.5–5.1)
SODIUM: 136 mmol/L (ref 135–145)
Total Bilirubin: 1.1 mg/dL (ref 0.3–1.2)
Total Protein: 6.7 g/dL (ref 6.5–8.1)

## 2015-09-11 LAB — URINALYSIS, ROUTINE W REFLEX MICROSCOPIC
BILIRUBIN URINE: NEGATIVE
Glucose, UA: NEGATIVE mg/dL
Ketones, ur: 40 mg/dL — AB
NITRITE: POSITIVE — AB
Protein, ur: NEGATIVE mg/dL
Urobilinogen, UA: 0.2 mg/dL (ref 0.0–1.0)
pH: 6.5 (ref 5.0–8.0)

## 2015-09-11 MED ORDER — IBUPROFEN 600 MG PO TABS: 600.0000 mg | ORAL_TABLET | Freq: Four times a day (QID) | ORAL | Status: DC | PRN

## 2015-09-11 MED ORDER — METRONIDAZOLE 500 MG PO TABS
2000.0000 mg | ORAL_TABLET | Freq: Once | ORAL | Status: AC
  Administered 2015-09-11: 2000 mg via ORAL

## 2015-09-11 MED ORDER — ULIPRISTAL ACETATE 30 MG PO TABS
ORAL_TABLET | ORAL | Status: AC
  Filled 2015-09-11: qty 1

## 2015-09-11 MED ORDER — AZITHROMYCIN 1 G PO PACK
1.0000 g | PACK | Freq: Once | ORAL | Status: AC
  Administered 2015-09-11: 1 g via ORAL

## 2015-09-11 MED ORDER — HYDROCODONE-ACETAMINOPHEN 5-325 MG PO TABS
1.0000 | ORAL_TABLET | Freq: Once | ORAL | Status: AC
Start: 2015-09-11 — End: 2015-09-11
  Administered 2015-09-11: 1 via ORAL
  Filled 2015-09-11: qty 1

## 2015-09-11 MED ORDER — MORPHINE SULFATE (PF) 4 MG/ML IV SOLN
4.0000 mg | Freq: Once | INTRAVENOUS | Status: AC
  Administered 2015-09-11: 4 mg via INTRAVENOUS
  Filled 2015-09-11: qty 1

## 2015-09-11 MED ORDER — PROMETHAZINE HCL 25 MG PO TABS
25.0000 mg | ORAL_TABLET | Freq: Four times a day (QID) | ORAL | Status: DC | PRN
  Administered 2015-09-11: 25 mg via ORAL
  Administered 2015-09-11: 50 mg via ORAL
  Filled 2015-09-11: qty 1

## 2015-09-11 MED ORDER — CEFIXIME 400 MG PO CAPS
400.0000 mg | ORAL_CAPSULE | Freq: Once | ORAL | Status: AC
  Administered 2015-09-11: 400 mg via ORAL
  Filled 2015-09-11: qty 1

## 2015-09-11 MED ORDER — PROMETHAZINE HCL 25 MG PO TABS
ORAL_TABLET | ORAL | Status: AC
  Administered 2015-09-11: 09:00:00 25 mg via ORAL
  Filled 2015-09-11: qty 3

## 2015-09-11 MED ORDER — SODIUM CHLORIDE 0.9 % IV BOLUS (SEPSIS)
1000.0000 mL | Freq: Once | INTRAVENOUS | Status: AC
  Administered 2015-09-11: 1000 mL via INTRAVENOUS

## 2015-09-11 MED ORDER — POTASSIUM CHLORIDE CRYS ER 20 MEQ PO TBCR
40.0000 meq | EXTENDED_RELEASE_TABLET | Freq: Once | ORAL | Status: AC
  Administered 2015-09-11: 40 meq via ORAL
  Filled 2015-09-11: qty 2

## 2015-09-11 MED ORDER — IOHEXOL 300 MG/ML  SOLN
100.0000 mL | Freq: Once | INTRAMUSCULAR | Status: AC | PRN
  Administered 2015-09-11: 100 mL via INTRAVENOUS

## 2015-09-11 MED ORDER — AZITHROMYCIN 1 G PO PACK
PACK | ORAL | Status: AC
  Administered 2015-09-11: 09:00:00 1 g via ORAL
  Filled 2015-09-11: qty 1

## 2015-09-11 MED ORDER — CEFIXIME 400 MG PO CAPS
ORAL_CAPSULE | ORAL | Status: AC
  Filled 2015-09-11: qty 1

## 2015-09-11 MED ORDER — METRONIDAZOLE 500 MG PO TABS
ORAL_TABLET | ORAL | Status: AC
  Administered 2015-09-11: 09:00:00 2000 mg via ORAL
  Filled 2015-09-11: qty 4

## 2015-09-11 MED ORDER — CEPHALEXIN 500 MG PO CAPS: 500.0000 mg | ORAL_CAPSULE | Freq: Three times a day (TID) | ORAL | Status: DC

## 2015-09-11 MED ORDER — ONDANSETRON HCL 4 MG/2ML IJ SOLN
4.0000 mg | Freq: Once | INTRAMUSCULAR | Status: AC
  Administered 2015-09-11: 4 mg via INTRAVENOUS
  Filled 2015-09-11: qty 2

## 2015-09-11 MED ORDER — HYDROCODONE-ACETAMINOPHEN 5-325 MG PO TABS: 1.0000 | ORAL_TABLET | Freq: Four times a day (QID) | ORAL | Status: DC | PRN

## 2015-09-11 NOTE — ED Notes (Signed)
SANE nurse at bedside.

## 2015-09-11 NOTE — Consult Note (Signed)
This RN, FNE consulted on pt. Pt declined evidence collection. MD notified.
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
## 2015-09-11 NOTE — Consult Note (Signed)
Notified by ED nurse of patient's arrival to ED and patient's chief complaints. Nurse states patient will need an abdominal CT and some labs prior to being ready for SANE consult and that patient just arrived to ED. Requested ED call when patient is medically cleared.
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
## 2015-09-11 NOTE — ED Notes (Signed)
CALL [facility] WHEN PT IS DISCHARGED [phone]
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
## 2015-09-11 NOTE — ED Provider Notes (Signed)
Patient seen by [facility] nurse.  Patient does not want kit to collect specimens. However she is going to lower close to be turned over. Prophylactic medication as per [facility] nurse. Patient ready for discharge.  Fredia Sorrow, MD 09/11/15 [phone]

## 2016-02-07 ENCOUNTER — Encounter (HOSPITAL_COMMUNITY): Payer: Self-pay | Admitting: Emergency Medicine

## 2016-02-07 ENCOUNTER — Emergency Department (HOSPITAL_COMMUNITY)
Admission: EM | Admit: 2016-02-07 | Discharge: 2016-02-07 | Disposition: A | Discharge status: Home / Self Care | Payer: Self-pay | Attending: Emergency Medicine | Admitting: Emergency Medicine

## 2016-02-07 DIAGNOSIS — M545 Low back pain, unspecified: Secondary | ICD-10-CM

## 2016-02-07 DIAGNOSIS — F17210 Nicotine dependence, cigarettes, uncomplicated: Secondary | ICD-10-CM | POA: Insufficient documentation

## 2016-02-07 DIAGNOSIS — M419 Scoliosis, unspecified: Secondary | ICD-10-CM | POA: Insufficient documentation

## 2016-02-07 DIAGNOSIS — Z8542 Personal history of malignant neoplasm of other parts of uterus: Secondary | ICD-10-CM | POA: Insufficient documentation

## 2016-02-07 DIAGNOSIS — G8929 Other chronic pain: Secondary | ICD-10-CM | POA: Insufficient documentation

## 2016-02-07 DIAGNOSIS — Z8659 Personal history of other mental and behavioral disorders: Secondary | ICD-10-CM | POA: Insufficient documentation

## 2016-02-07 DIAGNOSIS — Z792 Long term (current) use of antibiotics: Secondary | ICD-10-CM | POA: Insufficient documentation

## 2016-02-07 MED ORDER — HYDROCODONE-ACETAMINOPHEN 5-325 MG PO TABS
1.0000 | ORAL_TABLET | Freq: Once | ORAL | Status: AC
  Administered 2016-02-07: 1 via ORAL
  Filled 2016-02-07: qty 1

## 2016-02-07 MED ORDER — CYCLOBENZAPRINE HCL 10 MG PO TABS: 10.0000 mg | ORAL_TABLET | Freq: Two times a day (BID) | ORAL | Status: DC | PRN

## 2016-02-07 MED ORDER — CYCLOBENZAPRINE HCL 10 MG PO TABS
5.0000 mg | ORAL_TABLET | Freq: Once | ORAL | Status: AC
  Administered 2016-02-07: 5 mg via ORAL
  Filled 2016-02-07: qty 1

## 2016-02-07 MED ORDER — HYDROCODONE-ACETAMINOPHEN 5-325 MG PO TABS: 1.0000 | ORAL_TABLET | ORAL | Status: DC | PRN

## 2016-02-07 NOTE — ED Provider Notes (Signed)
CSN: [id number]     Arrival date & time 02/07/16  1819 History  By signing my name below, I, Hansel Feinstein, attest that this documentation has been prepared under the direction and in the presence of Delos Haring, PA-C. Electronically Signed: Hansel Feinstein, ED Scribe. 02/07/2016. 8:46 PM.    Chief Complaint  Patient presents with  . Back Pain   The history is provided by the patient. No language interpreter was used.    HPI Comments: Mallory Shaw is a 50 y.o. female with h/o DDD, scoliosis, chronic back pain who presents to the Emergency Department complaining of acute on chronic lower back pain onset today with associated episode of paresthesia to bilateral feet (now resolved). Pt states that she was lifting moving boxes recently, but states she was not doing any significant heavy lifting. Pt denies other recent trauma, injury, falls, twisting. Pt notes she has been trying [employer], aleve, advil, salon paas, and ice with little to no relief. She states certain positions, movement and ambulation worsens her pain. Pt states that she frequently has similar symptoms after certain movements d/t h/o scoliosis, DDD and notes that her pain is normally managed with ibuprofen. Pt denies bowel or bladder incontinence, saddle anesthesia, numbness, focal weakness or paresthesia of the lower extremities, fever, cough, abdominal pain, dysuria, hematuria, frequency, HA, dizziness.   Past Medical History  Diagnosis Date  . Substance abuse   . Uterine cancer ([place])   . Degenerative disk disease   . Depression   . Degenerative disc disease   . [id number])    Past Surgical History  Procedure Laterality Date  . Partial hysterectomy    . Tubal ligation    . Breast lumpectomy      Hx: of right breast  . Tonsillectomy    . Tonsillectomy and adenoidectomy Bilateral 12/20/2013    Procedure: TONSILLECTOMY;  Surgeon: Ruby Cola, MD;  Location: [facility] OR;  Service: ENT;  Laterality: Bilateral;   Family History   Problem Relation Age of Onset  . Hypertension Mother    Social History  Substance Use Topics  . Smoking status: Current Some Day Smoker -- 0.25 packs/day for 20 years    Types: Cigarettes  . Smokeless tobacco: Never Used  . Alcohol Use: Yes     Comment: Occasional   OB History    No data available     Review of Systems  Constitutional: Negative for fever.  Respiratory: Negative for cough.   Gastrointestinal: Negative for abdominal pain.  Genitourinary: Negative for dysuria, frequency and hematuria.  Musculoskeletal: Positive for back pain.  Neurological: Negative for dizziness, weakness, numbness and headaches.  All other systems reviewed and are negative.  Allergies  Tramadol  Home Medications   Prior to Admission medications   Medication Sig Start Date End Date Taking? Authorizing Provider  acetaminophen (TYLENOL) 500 MG tablet Take 500 mg by mouth every 6 (six) hours as needed for pain.    Historical Provider, MD  cephALEXin (KEFLEX) 500 MG capsule Take 1 capsule (500 mg total) by mouth 3 (three) times daily. 09/11/15   Julianne Rice, MD  cyclobenzaprine (FLEXERIL) 10 MG tablet Take 1 tablet (10 mg total) by mouth 2 (two) times daily as needed for muscle spasms. 02/07/16   Delos Haring, PA-C  HYDROcodone-acetaminophen (NORCO) 5-325 MG tablet Take 1 tablet by mouth every 6 (six) hours as needed for severe pain. 09/11/15   Julianne Rice, MD  HYDROcodone-acetaminophen (NORCO/VICODIN) 5-325 MG tablet Take 1 tablet by mouth every  4 (four) hours as needed. 02/07/16   Ethelene Closser Carlota Raspberry, PA-C  ibuprofen (ADVIL,MOTRIN) 600 MG tablet Take 1 tablet (600 mg total) by mouth every 6 (six) hours as needed. 09/11/15   Julianne Rice, MD   BP 127/85 mmHg  Pulse 105  Temp(Src) 98.3 F (36.8 C) (Oral)  Resp 20  Ht 4\' 11"  (1.499 m)  Wt 125 lb (56.7 kg)  BMI 25.23 kg/m2  SpO2 99% Physical Exam  Constitutional: She is oriented to person, place, and time. She appears well-developed and  well-nourished.  HENT:  Head: Normocephalic and atraumatic.  Eyes: Conjunctivae and EOM are normal. Pupils are equal, round, and reactive to light.  Neck: Normal range of motion. Neck supple.  Cardiovascular: Normal rate.   Pulmonary/Chest: Effort normal. No respiratory distress.  Abdominal: She exhibits no distension.  Musculoskeletal: Normal range of motion. She exhibits tenderness.  Tenderness around L5.   Neurological: She is alert and oriented to person, place, and time.  Symmetrical strength to bilateral lower extremities with intact sensation. Ambulatory without difficulty.   Skin: Skin is warm and dry.  Psychiatric: She has a normal mood and affect. Her behavior is normal.  Nursing note and vitals reviewed.   ED Course  Procedures (including critical care time) DIAGNOSTIC STUDIES: Oxygen Saturation is 99% on RA, normal by my interpretation.    COORDINATION OF CARE: 8:41 PM Discussed treatment plan with pt at bedside and pt agreed to plan.   MDM   Final diagnoses:  Midline low back pain without sciatica    Patient with back pain.  No neurological deficits and normal neuro exam.  Patient is ambulatory.  No loss of bowel or bladder control.  No concern for cauda equina. No red flags. No fever, night sweats, weight loss, IVDA, no recent procedure to back. No urinary symptoms suggestive of UTI.  Supportive care and return precaution discussed. Appears safe for discharge at this time. Follow up as indicated in discharge paperwork.  I observe that the patient has hx of substance abuse-- allergy to tramadol and has been trying Tylenol and Ibuprofen at home. Given rx for 6 tabs of 5 mg Vicodin since I do not see frequent pain visits and flexeril  I personally performed the services described in this documentation, which was scribed in my presence. The recorded information has been reviewed and is accurate.   Delos Haring, PA-C 02/07/16 2051  Merrily Pew, MD 02/07/16 [phone]

## 2016-02-07 NOTE — Discharge Instructions (Signed)
Back Injury Prevention °Back injuries can be very painful. They can also be difficult to heal. After having one back injury, you are more likely to injure your back again. It is important to learn how to avoid injuring or re-injuring your back. The following tips can help you to prevent a back injury. °WHAT SHOULD I KNOW ABOUT PHYSICAL FITNESS? °· Exercise for 30 minutes per day on most days of the week or as directed by your health care provider. Make sure to: °· Do aerobic exercises, such as walking, jogging, biking, or swimming. °· Do exercises that increase balance and strength, such as tai chi and yoga. These can decrease your risk of falling and injuring your back. °· Do stretching exercises to help with flexibility. °· Try to develop strong abdominal muscles. Your abdominal muscles provide a lot of the support that is needed by your back. °· Maintain a healthy weight.  This helps to decrease your risk of a back injury. °WHAT SHOULD I KNOW ABOUT MY DIET? °· Talk with your health care provider about your overall diet. Take supplements and vitamins only as directed by your health care provider. °· Talk with your health care provider about how much calcium and vitamin D you need each day. These nutrients help to prevent weakening of the bones (osteoporosis). Osteoporosis can cause broken (fractured) bones, which lead to back pain. °· Include good sources of calcium in your diet, such as dairy products, green leafy vegetables, and products that have had calcium added to them (fortified). °· Include good sources of vitamin D in your diet, such as milk and foods that are fortified with vitamin D. °WHAT SHOULD I KNOW ABOUT MY POSTURE? °· Sit up straight and stand up straight. Avoid leaning forward when you sit or hunching over when you stand. °· Choose chairs that have good low-back (lumbar) support. °· If you work at a desk, sit close to it so you do not need to lean over. Keep your chin tucked in. Keep your neck  drawn back, and keep your elbows bent at a right angle. Your arms should look like the letter "L." °· Sit high and close to the steering wheel when you drive. Add a lumbar support to your car seat, if needed. °· Avoid sitting or standing in one position for very long. Take breaks to get up, stretch, and walk around at least one time every hour. Take breaks every hour if you are driving for long periods of time. °· Sleep on your side with your knees slightly bent, or sleep on your back with a pillow under your knees. Do not lie on the front of your body to sleep. °WHAT SHOULD I KNOW ABOUT LIFTING, TWISTING, AND REACHING? °Lifting and Heavy Lifting °· Avoid heavy lifting, especially repetitive heavy lifting. If you must do heavy lifting: °· Stretch before lifting. °· Work slowly. °· Rest between lifts. °· Use a tool such as a cart or a dolly to move objects if one is available. °· Make several small trips instead of carrying one heavy load. °· Ask for help when you need it, especially when moving big objects. °· Follow these steps when lifting: °· Stand with your feet shoulder-width apart. °· Get as close to the object as you can. Do not try to pick up a heavy object that is far from your body. °· Use handles or lifting straps if they are available. °· Bend at your knees. Squat down, but keep your heels off the floor. °·   heavy object that is far from your body.   Use handles or lifting straps if they are available.   Bend at your knees. Squat down, but keep your heels off the floor.   Keep your shoulders pulled back, your chin tucked in, and your back straight.   Lift the object slowly while you tighten the muscles in your legs, abdomen, and buttocks. Keep the object as close to the center of your body as possible.   Follow these steps when putting down a heavy load:   Stand with your feet shoulder-width apart.   Lower the object slowly while you tighten the muscles in your legs, abdomen, and buttocks. Keep the object as close to the center of your body as possible.   Keep your shoulders pulled back, your chin tucked in, and your back straight.   Bend at your knees. Squat  down, but keep your heels off the floor.   Use handles or lifting straps if they are available.  Twisting and Reaching   Avoid lifting heavy objects above your waist.   Do not twist at your waist while you are lifting or carrying a load. If you need to turn, move your feet.   Do not bend over without bending at your knees.   Avoid reaching over your head, across a table, or for an object on a high surface.  WHAT ARE SOME OTHER TIPS?   Avoid wet floors and icy ground. Keep sidewalks clear of ice to prevent falls.   Do not sleep on a mattress that is too soft or too hard.   Keep items that are used frequently within easy reach.   Put heavier objects on shelves at waist level, and put lighter objects on lower or higher shelves.   Find ways to decrease your stress, such as exercise, massage, or relaxation techniques. Stress can build up in your muscles. Tense muscles are more vulnerable to injury.   Talk with your health care provider if you feel anxious or depressed. These conditions can make back pain worse.   Wear flat heel shoes with cushioned soles.   Avoid sudden movements.   Use both shoulder straps when carrying a backpack.   Do not use any tobacco products, including cigarettes, chewing tobacco, or electronic cigarettes. If you need help quitting, ask your health care provider.    This information is not intended to replace advice given to you by your health care provider. Make sure you discuss any questions you have with your health care provider.    Document Released: 01/05/2005 Document Revised: 04/14/2015 Document Reviewed: 12/02/2014  Elsevier Interactive Patient Education 2016 Else[employer]        Back Pain, Adult  Back pain is very common in adults.The cause of back pain is rarely dangerous and the pain often gets better over time.The cause of your back pain may not be known. Some common causes of back pain include:   Strain of the muscles or ligaments supporting the spine.   Wear  and tear (degeneration) of the spinal disks.   Arthritis.        Direct injury to the back.  For many people, back pain may return. Since back pain is rarely dangerous, most people can learn to manage this condition on their own.  HOME CARE INSTRUCTIONS  Watch your back pain for any changes. The following actions may help to lessen any discomfort you are feeling:   Remain active. It is stressful on your back to   stay in bed.Resting more than 1-2 days can delay your recovery.  Pay attention to your body when you bend and lift. The most comfortable positions are those that put less stress on your recovering back. Always use proper lifting techniques, including:  Bending your knees.  Keeping the load close to your body.  Avoiding twisting.  Find a comfortable position to sleep. Use a firm mattress and lie on your side with your knees slightly bent. If you lie on your back, put a pillow under your knees.  Avoid feeling anxious or stressed.Stress increases muscle tension and can worsen back pain.It is important to recognize when you are anxious or stressed and learn ways to manage it, such as with exercise.  Take medicines only as directed by your health care provider. Over-the-counter medicines to reduce pain and inflammation are often the most helpful.Your health care provider may prescribe muscle relaxant drugs.These medicines help dull your pain so you can more quickly return to your normal activities and healthy exercise.  Apply ice to the injured area:  Put ice in a plastic  bag.  Place a towel between your skin and the bag.  Leave the ice on for 20 minutes, 2-3 times a day for the first 2-3 days. After that, ice and heat may be alternated to reduce pain and spasms.  Maintain a healthy weight. Excess weight puts extra stress on your back and makes it difficult to maintain good posture. SEEK MEDICAL CARE IF:  You have pain that is not relieved with rest or medicine.  You have increasing pain going down into the legs or buttocks.  You have pain that does not improve in one week.  You have night pain.  You lose weight.  You have a fever or chills. SEEK IMMEDIATE MEDICAL CARE IF:   You develop new bowel or bladder control problems.  You have unusual weakness or numbness in your arms or legs.  You develop nausea or vomiting.  You develop abdominal pain.  You feel faint.   This information is not intended to replace advice given to you by your health care provider. Make sure you discuss any questions you have with your health care provider.   Document Released: 11/28/2005 Document Revised: 12/19/2014 Document Reviewed: 04/01/2014 Elsevier Interactive Patient Education [employer].

## 2016-02-07 NOTE — ED Notes (Signed)
Patient here from home with complaints lower back pain non radiating. States that she "pulled her back out" while lifting boxes. Pain 10/10.

## 2016-06-11 ENCOUNTER — Emergency Department (HOSPITAL_COMMUNITY)

## 2016-06-11 ENCOUNTER — Emergency Department (HOSPITAL_COMMUNITY)
Admission: EM | Admit: 2016-06-11 | Discharge: 2016-06-12 | Disposition: A | Discharge status: Home / Self Care | Attending: Emergency Medicine | Admitting: Emergency Medicine

## 2016-06-11 ENCOUNTER — Encounter (HOSPITAL_COMMUNITY): Payer: Self-pay

## 2016-06-11 DIAGNOSIS — Z8542 Personal history of malignant neoplasm of other parts of uterus: Secondary | ICD-10-CM | POA: Insufficient documentation

## 2016-06-11 DIAGNOSIS — F329 Major depressive disorder, single episode, unspecified: Secondary | ICD-10-CM | POA: Insufficient documentation

## 2016-06-11 DIAGNOSIS — R079 Chest pain, unspecified: Secondary | ICD-10-CM

## 2016-06-11 DIAGNOSIS — R0789 Other chest pain: Secondary | ICD-10-CM | POA: Insufficient documentation

## 2016-06-11 DIAGNOSIS — Z79899 Other long term (current) drug therapy: Secondary | ICD-10-CM | POA: Diagnosis not present

## 2016-06-11 DIAGNOSIS — F17210 Nicotine dependence, cigarettes, uncomplicated: Secondary | ICD-10-CM | POA: Insufficient documentation

## 2016-06-11 LAB — CBC WITH DIFFERENTIAL/PLATELET
BASOS ABS: 0 10*3/uL (ref 0.0–0.1)
BASOS PCT: 0 %
Eosinophils Absolute: 0.2 10*3/uL (ref 0.0–0.7)
Eosinophils Relative: 2 %
HEMATOCRIT: 37.5 % (ref 36.0–46.0)
Hemoglobin: 12.6 g/dL (ref 12.0–15.0)
LYMPHS PCT: 30 %
Lymphs Abs: 2.9 10*3/uL (ref 0.7–4.0)
MCH: 29.9 pg (ref 26.0–34.0)
MCHC: 33.6 g/dL (ref 30.0–36.0)
MCV: 89.1 fL (ref 78.0–100.0)
MONO ABS: 0.7 10*3/uL (ref 0.1–1.0)
Monocytes Relative: 8 %
NEUTROS ABS: 5.9 10*3/uL (ref 1.7–7.7)
NEUTROS PCT: 60 %
Platelets: 304 10*3/uL (ref 150–400)
RBC: 4.21 MIL/uL (ref 3.87–5.11)
RDW: 13 % (ref 11.5–15.5)
WBC: 9.7 10*3/uL (ref 4.0–10.5)

## 2016-06-11 LAB — D-DIMER, QUANTITATIVE: D-Dimer, Quant: <0.27 ug/mL-FEU (ref 0.00–0.50)

## 2016-06-11 LAB — BASIC METABOLIC PANEL
ANION GAP: 6 (ref 5–15)
BUN: 12 mg/dL (ref 6–20)
CALCIUM: 9.2 mg/dL (ref 8.9–10.3)
CO2: 30 mmol/L (ref 22–32)
Chloride: 105 mmol/L (ref 101–111)
Creatinine, Ser: 0.51 mg/dL (ref 0.44–1.00)
Glucose, Bld: 96 mg/dL (ref 65–99)
POTASSIUM: 4.5 mmol/L (ref 3.5–5.1)
Sodium: 141 mmol/L (ref 135–145)

## 2016-06-11 LAB — I-STAT TROPONIN, ED: TROPONIN I, POC: 0 ng/mL (ref 0.00–0.08)

## 2016-06-11 MED ORDER — HYDROMORPHONE HCL 1 MG/ML IJ SOLN
0.5000 mg | Freq: Once | INTRAMUSCULAR | Status: AC
  Administered 2016-06-11: 0.5 mg via INTRAVENOUS
  Filled 2016-06-11: qty 1

## 2016-06-11 MED ORDER — HYDROMORPHONE HCL 1 MG/ML IJ SOLN
0.5000 mg | Freq: Once | INTRAMUSCULAR | Status: AC
  Administered 2016-06-12: 0.5 mg via INTRAVENOUS
  Filled 2016-06-11: qty 1

## 2016-06-11 NOTE — ED Provider Notes (Signed)
CSN: [id number]     Arrival date & time 06/11/16  2054 History   First MD Initiated Contact with Patient 06/11/16 2130     Chief Complaint  Patient presents with  . 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 States being in her usual state of health and around noon developed chest tightness and stabbing pain across her chest that has gradually worsened throughout the day. This felt lightheaded and shortness of breath. States that it is difficult for her to take a deep breath as it worsens her pain. Has not had cough, fevers, or other upper respiratory symptoms. Has not had lower extremity edema or pain. No recent immobilization. She does come from [facility] facility. Denies any trauma or falls. Pain not worse with movement, palpation, not worse with activity. She was given 3 nitroglycerin prior to arrival, with mild improvement in symptoms, but states that overall pain has been worsening.   Past Medical History  Diagnosis Date  . Substance abuse   . Uterine cancer ([place])   . Degenerative disk disease   . Depression   . Degenerative disc disease   . [id number])    Past Surgical History  Procedure Laterality Date  . Partial hysterectomy    . Tubal ligation    . Breast lumpectomy      Hx: of right breast  . Tonsillectomy    . Tonsillectomy and adenoidectomy Bilateral 12/20/2013    Procedure: TONSILLECTOMY;  Surgeon: Ruby Cola, MD;  Location: [facility] OR;  Service: ENT;  Laterality: Bilateral;   Family History  Problem Relation Age of Onset  . Hypertension Mother    Social History  Substance Use Topics  . Smoking status: Current Some Day Smoker -- 0.25 packs/day for 20 years    Types: Cigarettes  . Smokeless tobacco: Never Used  . Alcohol Use: Yes     Comment:  Occasional   OB History    No data available     Review of Systems 10/14 systems reviewed and are negative other than those stated in the HPI   Allergies  Tramadol  Home Medications   Prior to Admission medications   Medication Sig Start Date End Date Taking? Authorizing Provider  ACETAMINOPHEN PO Take by mouth every 6 (six) hours as needed (pain.).   Yes Historical Provider, MD  alum & mag hydroxide-simeth (MAALOX/MYLANTA) 200-200-20 MG/5ML suspension Take 30 mLs by mouth every 6 (six) hours as needed for indigestion or heartburn.   Yes Historical Provider, MD  cyclobenzaprine (FLEXERIL) 10 MG tablet Take 1 tablet (10 mg total) by mouth 2 (two) times daily as needed for muscle spasms. Patient not taking: Reported on 06/11/2016 02/07/16   Delos Haring, PA-C  HYDROcodone-acetaminophen (NORCO) 5-325 MG tablet Take 1 tablet by mouth every 6 (six) hours as needed for severe pain. Patient not taking: Reported on 06/11/2016 09/11/15   Julianne Rice, MD  HYDROcodone-acetaminophen (NORCO/VICODIN) 5-325 MG tablet Take 1 tablet by mouth every 4 (four) hours as needed. Patient not taking: Reported on 06/11/2016 02/07/16   Delos Haring, PA-C  ibuprofen (ADVIL,MOTRIN) 600 MG tablet Take 1 tablet (600 mg total) by mouth every 6 (six) hours as needed. Patient not taking: Reported on 06/11/2016 09/11/15   Julianne Rice, MD   BP 119/83 mmHg  Pulse 75  Temp(Src) 98.4 F (36.9 C) (Oral)  Resp 22  SpO2 99%  Physical Exam Physical Exam  Nursing note and vitals reviewed. Constitutional: Tearful and anxious, non-toxic, and in no acute distress Head: Normocephalic and atraumatic.  Mouth/Throat: Oropharynx is clear and moist.  Neck: Normal range of motion. Neck supple.  Cardiovascular: Normal rate and regular rhythm.   Pulmonary/Chest: Effort normal and breath sounds normal.  Abdominal: Soft. There is no tenderness. There is no rebound and no guarding.  Musculoskeletal: Normal range of motion.   Neurological: Alert, no facial droop, fluent speech, moves all extremities symmetrically Skin: Skin is warm and dry.  Psychiatric: Cooperative  ED Course  Procedures (including critical care time) Labs Review Labs Reviewed  D-DIMER, QUANTITATIVE (NOT AT [facility])  CBC WITH DIFFERENTIAL/PLATELET  BASIC METABOLIC PANEL  I-STAT TROPOININ, ED  Randolm Idol, ED    Imaging Review Dg Chest 2 View  06/11/2016  CLINICAL DATA:  50 year old female with chest pain EXAM: CHEST  2 VIEW COMPARISON:  Stop FINDINGS: The heart size and mediastinal contours are within normal limits. Both lungs are clear. The visualized skeletal structures are unremarkable. IMPRESSION: No active cardiopulmonary disease. Electronically Signed   By: Anner Crete M.D.   On: 06/11/2016 22:52   I have personally reviewed and evaluated these images and lab results as part of my medical decision-making.   EKG Interpretation   Date/Time:  Saturday June 11 2016 21:02:47 EDT Ventricular Rate:  90 PR Interval:    QRS Duration: 92 QT Interval:  383 QTC Calculation: 469 R Axis:   64 Text Interpretation:  Sinus rhythm No acute ischemic changes Confirmed by  Demontay Grantham MD, Hinton Dyer [id number]) on 06/11/2016 9:34:57 PM      MDM   Final diagnoses:  Chest pain, unspecified chest pain type    50 year old female who presents with chest pain. Vital signs within normal limits on presentation. She is tearful and endorsing significant amount of pain. Her EKG does not show acute ischemic changes and troponin is negative 1. She has a low heart score, and will be adequately ruled out with 3 hour serial troponin and EKG. History is not suggestive of ACS. Given pleuritic nature of pain with shortness of breath, we'll obtain CT of the chest to rule out PE she does have history of DVT and is no longer on anticoagulation. If negative will be discharged back to correctional facility    Forde Dandy, MD 06/12/16 [phone]

## 2016-06-11 NOTE — ED Notes (Addendum)
Pt BIB [facility]. Pt complaining of chest pain on both the left and right sides. Denies radiation, but endorses shortness of breath and lightheadedness. Denies N/V. Pt is tearful in triage. A&Ox4. Denies cardiac history.

## 2016-06-11 NOTE — ED Notes (Signed)
Per [facility] nurse Pt was given mylanta with no relief. Pt has had 3 nitro-last one at 2016. Pt reports that first nitro helped but others did not. No ASA given. [facility] at the jail, but denies it  at this time.

## 2016-06-12 ENCOUNTER — Emergency Department (HOSPITAL_COMMUNITY)

## 2016-06-12 DIAGNOSIS — R0789 Other chest pain: Secondary | ICD-10-CM | POA: Diagnosis not present

## 2016-06-12 LAB — I-STAT TROPONIN, ED: TROPONIN I, POC: 0 ng/mL (ref 0.00–0.08)

## 2016-06-12 MED ORDER — IOPAMIDOL (ISOVUE-370) INJECTION 76%
100.0000 mL | Freq: Once | INTRAVENOUS | Status: AC | PRN
  Administered 2016-06-12: 100 mL via INTRAVENOUS

## 2016-06-12 NOTE — Discharge Instructions (Signed)
Continue taking motrin and tylenol for pain control.   Nonspecific Chest Pain  Chest pain can be caused by many different conditions. There is always a chance that your pain could be related to something serious, such as a heart attack or a blood clot in your lungs. Chest pain can also be caused by conditions that are not life-threatening. If you have chest pain, it is very important to follow up with your health care provider. CAUSES  Chest pain can be caused by:  Heartburn.  Pneumonia or bronchitis.  Anxiety or stress.  Inflammation around your heart (pericarditis) or lung (pleuritis or pleurisy).  A blood clot in your lung.  A collapsed lung (pneumothorax). It can develop suddenly on its own (spontaneous pneumothorax) or from trauma to the chest.  Shingles infection (varicella-zoster virus).  Heart attack.  Damage to the bones, muscles, and cartilage that make up your chest wall. This can include:  Bruised bones due to injury.  Strained muscles or cartilage due to frequent or repeated coughing or overwork.  Fracture to one or more ribs.  Sore cartilage due to inflammation (costochondritis). RISK FACTORS  Risk factors for chest pain may include:  Activities that increase your risk for trauma or injury to your chest.  Respiratory infections or conditions that cause frequent coughing.  Medical conditions or overeating that can cause heartburn.  Heart disease or family history of heart disease.  Conditions or health behaviors that increase your risk of developing a blood clot.  Having had chicken pox (varicella zoster). SIGNS AND SYMPTOMS Chest pain can feel like:  Burning or tingling on the surface of your chest or deep in your chest.  Crushing, pressure, aching, or squeezing pain.  Dull or sharp pain that is worse when you move, cough, or take a deep breath.  Pain that is also felt in your back, neck, shoulder, or arm, or pain that spreads to any of these  areas. Your chest pain may come and go, or it may stay constant. DIAGNOSIS Lab tests or other studies may be needed to find the cause of your pain. Your health care provider may have you take a test called an ambulatory ECG (electrocardiogram). An ECG records your heartbeat patterns at the time the test is performed. You may also have other tests, such as:  Transthoracic echocardiogram (TTE). During echocardiography, sound waves are used to create a picture of all of the heart structures and to look at how blood flows through your heart.  Transesophageal echocardiogram (TEE).This is a more advanced imaging test that obtains images from inside your body. It allows your health care provider to see your heart in finer detail.  Cardiac monitoring. This allows your health care provider to monitor your heart rate and rhythm in real time.  Holter monitor. This is a portable device that records your heartbeat and can help to diagnose abnormal heartbeats. It allows your health care provider to track your heart activity for several days, if needed.  Stress tests. These can be done through exercise or by taking medicine that makes your heart beat more quickly.  Blood tests.  Imaging tests. TREATMENT  Your treatment depends on what is causing your chest pain. Treatment may include:  Medicines. These may include:  Acid blockers for heartburn.  Anti-inflammatory medicine.  Pain medicine for inflammatory conditions.  Antibiotic medicine, if an infection is present.  Medicines to dissolve blood clots.  Medicines to treat coronary artery disease.  Supportive care for conditions that do not  require medicines. This may include:  Resting.  Applying heat or cold packs to injured areas.  Limiting activities until pain decreases. HOME CARE INSTRUCTIONS  If you were prescribed an antibiotic medicine, finish it all even if you start to feel better.  Avoid any activities that bring on chest  pain.  Do not use any tobacco products, including cigarettes, chewing tobacco, or electronic cigarettes. If you need help quitting, ask your health care provider.  Do not drink alcohol.  Take medicines only as directed by your health care provider.  Keep all follow-up visits as directed by your health care provider. This is important. This includes any further testing if your chest pain does not go away.  If heartburn is the cause for your chest pain, you may be told to keep your head raised (elevated) while sleeping. This reduces the chance that acid will go from your stomach into your esophagus.  Make lifestyle changes as directed by your health care provider. These may include:  Getting regular exercise. Ask your health care provider to suggest some activities that are safe for you.  Eating a heart-healthy diet. A registered dietitian can help you to learn healthy eating options.  Maintaining a healthy weight.  Managing diabetes, if necessary.  Reducing stress. SEEK MEDICAL CARE IF:  Your chest pain does not go away after treatment.  You have a rash with blisters on your chest.  You have a fever. SEEK IMMEDIATE MEDICAL CARE IF:   Your chest pain is worse.  You have an increasing cough, or you cough up blood.  You have severe abdominal pain.  You have severe weakness.  You faint.  You have chills.  You have sudden, unexplained chest discomfort.  You have sudden, unexplained discomfort in your arms, back, neck, or jaw.  You have shortness of breath at any time.  You suddenly start to sweat, or your skin gets clammy.  You feel nauseous or you vomit.  You suddenly feel light-headed or dizzy.  Your heart begins to beat quickly, or it feels like it is skipping beats. These symptoms may represent a serious problem that is an emergency. Do not wait to see if the symptoms will go away. Get medical help right away. Call your local emergency services (911 in the  [place] Do not drive yourself to the hospital.   This information is not intended to replace advice given to you by your health care provider. Make sure you discuss any questions you have with your health care provider.   Document Released: 09/07/2005 Document Revised: 12/19/2014 Document Reviewed: 07/04/2014 Elsevier Interactive Patient Education [employer]

## 2016-06-12 NOTE — ED Provider Notes (Signed)
EKG Interpretation  Date/Time:  Sunday June 12 2016 00:54:38 EDT Ventricular Rate:  69 PR Interval:    QRS Duration: 94 QT Interval:  406 QTC Calculation: 435 R Axis:   53 Text Interpretation:  Sinus rhythm Abnormal R-wave progression, early transition No significant change since last tracing Confirmed by Christy Gentles  MD, King Pinzon ([id number]) on 06/12/2016 1:36:45 AM        Ripley Fraise, MD 06/12/16 [phone]

## 2016-06-12 NOTE — ED Provider Notes (Signed)
Workup negative Pt stable Will discharge BP 122/79 mmHg  Pulse 67  Temp(Src) 98.4 F (36.9 C) (Oral)  Resp 21  SpO2 98%   Ripley Fraise, MD 06/12/16 [phone]

## 2016-12-30 ENCOUNTER — Emergency Department (HOSPITAL_BASED_OUTPATIENT_CLINIC_OR_DEPARTMENT_OTHER)
Admission: EM | Admit: 2016-12-30 | Discharge: 2016-12-30 | Disposition: A | Discharge status: Home / Self Care | Attending: Emergency Medicine | Admitting: Emergency Medicine

## 2016-12-30 ENCOUNTER — Encounter (HOSPITAL_BASED_OUTPATIENT_CLINIC_OR_DEPARTMENT_OTHER): Payer: Self-pay

## 2016-12-30 DIAGNOSIS — F17210 Nicotine dependence, cigarettes, uncomplicated: Secondary | ICD-10-CM | POA: Insufficient documentation

## 2016-12-30 DIAGNOSIS — N760 Acute vaginitis: Secondary | ICD-10-CM

## 2016-12-30 DIAGNOSIS — A5901 Trichomonal vulvovaginitis: Secondary | ICD-10-CM | POA: Insufficient documentation

## 2016-12-30 DIAGNOSIS — F1490 Cocaine use, unspecified, uncomplicated: Secondary | ICD-10-CM | POA: Insufficient documentation

## 2016-12-30 DIAGNOSIS — Z8542 Personal history of malignant neoplasm of other parts of uterus: Secondary | ICD-10-CM | POA: Insufficient documentation

## 2016-12-30 DIAGNOSIS — B9689 Other specified bacterial agents as the cause of diseases classified elsewhere: Secondary | ICD-10-CM

## 2016-12-30 DIAGNOSIS — A599 Trichomoniasis, unspecified: Secondary | ICD-10-CM

## 2016-12-30 DIAGNOSIS — N390 Urinary tract infection, site not specified: Secondary | ICD-10-CM

## 2016-12-30 LAB — WET PREP, GENITAL
Sperm: NONE SEEN
YEAST WET PREP: NONE SEEN

## 2016-12-30 LAB — URINALYSIS, MICROSCOPIC (REFLEX)

## 2016-12-30 LAB — URINALYSIS, ROUTINE W REFLEX MICROSCOPIC
Bilirubin Urine: NEGATIVE
GLUCOSE, UA: NEGATIVE mg/dL
Hgb urine dipstick: NEGATIVE
KETONES UR: NEGATIVE mg/dL
Nitrite: NEGATIVE
PH: 6.5 (ref 5.0–8.0)
Protein, ur: NEGATIVE mg/dL
Specific Gravity, Urine: 1.016 (ref 1.005–1.030)

## 2016-12-30 MED ORDER — CEFTRIAXONE SODIUM 250 MG IJ SOLR
250.0000 mg | Freq: Once | INTRAMUSCULAR | Status: AC
  Administered 2016-12-30: 250 mg via INTRAMUSCULAR
  Filled 2016-12-30: qty 250

## 2016-12-30 MED ORDER — METRONIDAZOLE 500 MG PO TABS: 500.0000 mg | ORAL_TABLET | Freq: Two times a day (BID) | ORAL | 0 refills | Status: AC

## 2016-12-30 MED ORDER — NITROFURANTOIN MONOHYD MACRO 100 MG PO CAPS: 100.0000 mg | ORAL_CAPSULE | Freq: Two times a day (BID) | ORAL | 0 refills | Status: AC

## 2016-12-30 MED ORDER — AZITHROMYCIN 250 MG PO TABS
1000.0000 mg | ORAL_TABLET | Freq: Once | ORAL | Status: AC
  Administered 2016-12-30: 1000 mg via ORAL
  Filled 2016-12-30: qty 4

## 2016-12-30 NOTE — ED Triage Notes (Signed)
C/o vaginal d/c x 3 weeks-pt is at [facility]

## 2016-12-30 NOTE — Discharge Instructions (Signed)
Please take antibiotics for your pelvic infections and potential UTI.  Please return without fail for worsening symptoms, including severe abdominal pain, intractable vomiting, fevers, or any other symptoms concerning to you.

## 2016-12-30 NOTE — ED Provider Notes (Signed)
[facility] DEPT [facility] Provider Note   CSN: [id number] Arrival date & time: 12/30/16  1133     History   Chief Complaint Chief Complaint  Patient presents with  . Vaginal Discharge    HPI Mallory Shaw is a 51 y.o. female.  HPI 51 year old female who presents with abnormal Vaginal discharge. She has a history of substance abuse, partial hysterectomy. States that she had relapse recently, and was in detox for treatment. During that time she states that she practices unsafe sexual intercourse. Over the past 3 weeks she has had abnormal thick foul-smelling white vaginal discharge. Has had intermittent abdominal cramping. No vomiting, flank pain, fever but having some chills. Also noting urinary frequency and dysuria during this time. States that she was screened for HIV and syphilis at [place] recently, with results pending.    Past Medical History:  Diagnosis Date  . Degenerative disc disease   . Degenerative disk disease   . Depression   . Headache(784.0)   . Substance abuse   . Uterine cancer [facility])     Patient Active Problem List   Diagnosis Date Noted  . Opiate addiction ([place]) 01/11/2012    Class: Acute  . Hematuria - cause not known 01/11/2012    Past Surgical History:  Procedure Laterality Date  . ABDOMINAL HYSTERECTOMY    . BREAST LUMPECTOMY     Hx: of right breast  . PARTIAL HYSTERECTOMY    . TONSILLECTOMY    . TONSILLECTOMY AND ADENOIDECTOMY Bilateral 12/20/2013   Procedure: TONSILLECTOMY;  Surgeon: Ruby Cola, MD;  Location: [facility];  Service: ENT;  Laterality: Bilateral;  . TUBAL LIGATION      OB History    No data available       Home Medications    Prior to Admission medications   Medication Sig Start Date End Date Taking? Authorizing Provider  metroNIDAZOLE (FLAGYL) 500 MG tablet Take 1 tablet (500 mg total) by mouth 2 (two) times daily. 12/30/16   Forde Dandy, MD  nitrofurantoin, macrocrystal-monohydrate, (MACROBID) 100 MG capsule Take 1  capsule (100 mg total) by mouth 2 (two) times daily. 12/30/16   Forde Dandy, MD    Family History Family History  Problem Relation Age of Onset  . Hypertension Mother     Social History Social History  Substance Use Topics  . Smoking status: Current Some Day Smoker    Packs/day: 0.25    Years: 20.00    Types: Cigarettes  . Smokeless tobacco: Never Used     Comment: not allowed in rehab  . Alcohol use No     Comment: in rehab     Allergies   Tramadol   Review of Systems Review of Systems 10/14 systems reviewed and are negative other than those stated in the HPI   Physical Exam Updated Vital Signs BP 127/98   Pulse 76   Temp 97.8 F (36.6 C) (Oral)   Resp 16   Ht 5' (1.524 m)   Wt 120 lb (54.4 kg)   SpO2 100%   BMI 23.44 kg/m   Physical Exam Physical Exam  Nursing note and vitals reviewed. Constitutional: Well developed, well nourished, non-toxic, and in no acute distress Head: Normocephalic and atraumatic.  Mouth/Throat: Oropharynx is clear and moist.  Neck: Normal range of motion. Neck supple.  Cardiovascular: Normal rate and regular rhythm.   Pulmonary/Chest: Effort normal and breath sounds normal.  Abdominal: Soft. There is no tenderness. There is no rebound and no  guarding.  Pelvic: Normal external genitalia. Absent cervix. Copious white thick vaginal discharge. No blood within the vagina. No adnexal masses or tenderness. Musculoskeletal: Normal range of motion.  Neurological: Alert, no facial droop, fluent speech, moves all extremities symmetrically Skin: Skin is warm and dry.  Psychiatric: Cooperative   ED Treatments / Results  Labs (all labs ordered are listed, but only abnormal results are displayed) Labs Reviewed  WET PREP, GENITAL - Abnormal; Notable for the following:       Result Value   Trich, Wet Prep PRESENT (*)    Clue Cells Wet Prep HPF POC PRESENT (*)    WBC, Wet Prep HPF POC MANY (*)    All other components within normal limits    URINALYSIS, ROUTINE W REFLEX MICROSCOPIC - Abnormal; Notable for the following:    APPearance CLOUDY (*)    Leukocytes, UA MODERATE (*)    All other components within normal limits  URINALYSIS, MICROSCOPIC (REFLEX) - Abnormal; Notable for the following:    Bacteria, UA MANY (*)    Squamous Epithelial / LPF 6-30 (*)    All other components within normal limits  URINE CULTURE  GC/CHLAMYDIA PROBE AMP ([place]) NOT AT [facility]    EKG  EKG Interpretation None       Radiology No results found.  Procedures Procedures (including critical care time)  Medications Ordered in ED Medications  cefTRIAXone (ROCEPHIN) injection 250 mg (250 mg Intramuscular Given 12/30/16 1555)  azithromycin (ZITHROMAX) tablet 1,000 mg (1,000 mg Oral Given 12/30/16 1554)     Initial Impression / Assessment and Plan / ED Course  I have reviewed the triage vital signs and the nursing notes.  Pertinent labs & imaging results that were available during my care of the patient were reviewed by me and considered in my medical decision making (see chart for details).     Presenting with abnormal vaginal discharge and requesting empiric STD treatment. Early screened for HIV and syphilis at [facility] she states. Vital signs stable. Given empiric treatments for gc/chlamydia. Wet prep also positive for Trichomonas and BV which she is given a course of Flagyl. Also having UTI symptoms, and to save this may be related to STDs or not. Sent for culture and given course of Macrobid. No concerns for pregnancy due to partial hysterectomy. Exam not concerning for TOA/PID at this time.  Strict return and follow-up instructions reviewed. She expressed understanding of all discharge instructions and felt comfortable with the plan of care.   Final Clinical Impressions(s) / ED Diagnoses   Final diagnoses:  BV (bacterial vaginosis)  Trichomonas infection  Lower urinary tract infectious disease    New Prescriptions New  Prescriptions   METRONIDAZOLE (FLAGYL) 500 MG TABLET    Take 1 tablet (500 mg total) by mouth 2 (two) times daily.   NITROFURANTOIN, MACROCRYSTAL-MONOHYDRATE, (MACROBID) 100 MG CAPSULE    Take 1 capsule (100 mg total) by mouth 2 (two) times daily.     Forde Dandy, MD 12/30/16 [phone]

## 2017-01-01 LAB — URINE CULTURE: CULTURE: NO GROWTH

## 2017-01-02 LAB — GC/CHLAMYDIA PROBE AMP (~~LOC~~) NOT AT ARMC
CHLAMYDIA, DNA PROBE: NEGATIVE
Neisseria Gonorrhea: NEGATIVE

## 2017-02-17 ENCOUNTER — Emergency Department (HOSPITAL_BASED_OUTPATIENT_CLINIC_OR_DEPARTMENT_OTHER)

## 2017-02-17 ENCOUNTER — Encounter (HOSPITAL_BASED_OUTPATIENT_CLINIC_OR_DEPARTMENT_OTHER): Payer: Self-pay | Admitting: *Deleted

## 2017-02-17 ENCOUNTER — Emergency Department (HOSPITAL_BASED_OUTPATIENT_CLINIC_OR_DEPARTMENT_OTHER)
Admission: EM | Admit: 2017-02-17 | Discharge: 2017-02-17 | Disposition: A | Discharge status: Home / Self Care | Attending: Emergency Medicine | Admitting: Emergency Medicine

## 2017-02-17 DIAGNOSIS — Y9389 Activity, other specified: Secondary | ICD-10-CM | POA: Insufficient documentation

## 2017-02-17 DIAGNOSIS — Y999 Unspecified external cause status: Secondary | ICD-10-CM | POA: Insufficient documentation

## 2017-02-17 DIAGNOSIS — F1490 Cocaine use, unspecified, uncomplicated: Secondary | ICD-10-CM | POA: Insufficient documentation

## 2017-02-17 DIAGNOSIS — W010XXA Fall on same level from slipping, tripping and stumbling without subsequent striking against object, initial encounter: Secondary | ICD-10-CM | POA: Insufficient documentation

## 2017-02-17 DIAGNOSIS — Y929 Unspecified place or not applicable: Secondary | ICD-10-CM | POA: Insufficient documentation

## 2017-02-17 DIAGNOSIS — F17210 Nicotine dependence, cigarettes, uncomplicated: Secondary | ICD-10-CM | POA: Insufficient documentation

## 2017-02-17 DIAGNOSIS — S8991XA Unspecified injury of right lower leg, initial encounter: Secondary | ICD-10-CM | POA: Insufficient documentation

## 2017-02-17 DIAGNOSIS — Z8542 Personal history of malignant neoplasm of other parts of uterus: Secondary | ICD-10-CM | POA: Insufficient documentation

## 2017-02-17 MED ORDER — IBUPROFEN 800 MG PO TABS: 800.0000 mg | ORAL_TABLET | Freq: Three times a day (TID) | ORAL | 0 refills | Status: AC

## 2017-02-17 MED ORDER — KETOROLAC TROMETHAMINE 60 MG/2ML IM SOLN
60.0000 mg | Freq: Once | INTRAMUSCULAR | Status: AC
  Administered 2017-02-17: 60 mg via INTRAMUSCULAR
  Filled 2017-02-17: qty 2

## 2017-02-17 NOTE — Discharge Instructions (Signed)
There were no fractures or dislocations on x-ray. Keep the extremity elevated whenever possible. Use the crutches to avoid weightbearing. Keep the knee sleeve in place for compression to decrease swelling.  Antiinflammatory medications: Take 500 mg of naproxen every 12 hours or 800 mg of ibuprofen every 8 hours for the next 3 days. Take these medications with food to avoid upset stomach. Choose only one of these medications, do not take them together.  Exercises: Be sure to perform the attached exercises starting with three times a week and working up to performing them daily. This is an essential part of preventing long term problems.   Follow up with the orthopedic specialist for any future management of these complaints. Call the number provided to set up an appointment.

## 2017-02-17 NOTE — ED Triage Notes (Signed)
She fell a week ago and landed on her right knee. Pain has increased. Not it is swollen and she feels she has fluid on her knee.

## 2017-02-17 NOTE — ED Provider Notes (Signed)
[facility] Provider Note   CSN: [id number] Arrival date & time: 02/17/17  1045     History   Chief Complaint Chief Complaint  Patient presents with  . Knee Pain    HPI Mallory Shaw is a 51 y.o. female.  HPI   Mallory Shaw is a 51 y.o. female, with a history of Opioid addiction and DDD, presenting to the ED with right knee that occurred about a week ago. Patient states she was helping a friend move, tripped, and landed on her left knee. She has been ambulatory since the incident. She has also gone to the gym and lifted weights. Pain ranges from mild to moderate, aching/throbbing, nonradiating, worse with ambulation. Patient has tried ice, heat, Aleve, and Tylenol, with varying levels of relief. She denies neuro deficits, other injuries, or any other complaints.   Past Medical History:  Diagnosis Date  . Degenerative disc disease   . Degenerative disk disease   . Depression   . Headache(784.0)   . Substance abuse   . Uterine cancer [facility])     Patient Active Problem List   Diagnosis Date Noted  . Opiate addiction ([place]) 01/11/2012    Class: Acute  . Hematuria - cause not known 01/11/2012    Past Surgical History:  Procedure Laterality Date  . ABDOMINAL HYSTERECTOMY    . BREAST LUMPECTOMY     Hx: of right breast  . PARTIAL HYSTERECTOMY    . TONSILLECTOMY    . TONSILLECTOMY AND ADENOIDECTOMY Bilateral 12/20/2013   Procedure: TONSILLECTOMY;  Surgeon: Ruby Cola, MD;  Location: [place];  Service: ENT;  Laterality: Bilateral;  . TUBAL LIGATION      OB History    No data available       Home Medications    Prior to Admission medications   Medication Sig Start Date End Date Taking? Authorizing Provider  ibuprofen (ADVIL,MOTRIN) 800 MG tablet Take 1 tablet (800 mg total) by mouth 3 (three) times daily. 02/17/17   Shatona Andujar C Brannan Cassedy, PA-C  metroNIDAZOLE (FLAGYL) 500 MG tablet Take 1 tablet (500 mg total) by mouth 2 (two) times daily. 12/30/16   Forde Dandy, MD    nitrofurantoin, macrocrystal-monohydrate, (MACROBID) 100 MG capsule Take 1 capsule (100 mg total) by mouth 2 (two) times daily. 12/30/16   Forde Dandy, MD    Family History Family History  Problem Relation Age of Onset  . Hypertension Mother     Social History Social History  Substance Use Topics  . Smoking status: Current Some Day Smoker    Packs/day: 0.25    Years: 20.00    Types: Cigarettes  . Smokeless tobacco: Never Used     Comment: not allowed in rehab  . Alcohol use No     Comment: in rehab     Allergies   Tramadol   Review of Systems Review of Systems  Musculoskeletal: Positive for arthralgias. Negative for joint swelling.  Skin: Negative for wound.  Neurological: Negative for weakness and numbness.     Physical Exam Updated Vital Signs BP 133/95 (BP Location: Right Arm)   Pulse 82   Temp 98.3 F (36.8 C)   Resp 18   SpO2 99%   Physical Exam  Constitutional: She appears well-developed and well-nourished. No distress.  HENT:  Head: Normocephalic and atraumatic.  Eyes: Conjunctivae are normal.  Neck: Neck supple.  Cardiovascular: Normal rate, regular rhythm and intact distal pulses.   Pulmonary/Chest: Effort normal.  Musculoskeletal: She exhibits  tenderness.  Diffuse inferior patellar right knee tenderness without noted erythema, swelling, effusion, crepitus, or laxity. Patient is weightbearing. Full flexion and extension of the knee. Full range of motion at the right hip and ankle.  Neurological: She is alert.  No sensory deficits in the right lower extremity. Strength at the right knee, hip, and ankle is 5 out of 5.  Skin: Skin is warm and dry. She is not diaphoretic.  Psychiatric: She has a normal mood and affect. Her behavior is normal.  Nursing note and vitals reviewed.    ED Treatments / Results  Labs (all labs ordered are listed, but only abnormal results are displayed) Labs Reviewed - No data to display  EKG  EKG  Interpretation None       Radiology Dg Knee Complete 4 Views Right  Result Date: 02/17/2017 CLINICAL DATA:  [place] 1 week ago.  Persistent pain and swelling. EXAM: RIGHT KNEE - COMPLETE 4+ VIEW COMPARISON:  None. FINDINGS: The joint spaces are maintained. No acute fracture. No osteochondral lesion. No significant degenerative changes. Small joint effusion. IMPRESSION: No acute bony findings or significant degenerative changes. Small joint effusion. Electronically Signed   By: Marijo Sanes M.D.   On: 02/17/2017 11:19    Procedures Procedures (including critical care time)  Medications Ordered in ED Medications  ketorolac (TORADOL) injection 60 mg (60 mg Intramuscular Given 02/17/17 1441)     Initial Impression / Assessment and Plan / ED Course  I have reviewed the triage vital signs and the nursing notes.  Pertinent labs & imaging results that were available during my care of the patient were reviewed by me and considered in my medical decision making (see chart for details).     Patient presents with a right knee injury that occurred a week ago. No significant abnormalities on x-ray. Patient is weightbearing and ambulatory. Orthopedic follow-up. Return precautions discussed. Patient voices understanding all instructions and is comfortable with discharge.  Vitals:   02/17/17 1056 02/17/17 1331 02/17/17 1430  BP: 146/100 126/68 133/95  Pulse: 77 87 82  Resp: 20  18  Temp: 98 F (36.7 C) 98.3 F (36.8 C)   TempSrc: Oral    SpO2: 98% 100% 99%      Final Clinical Impressions(s) / ED Diagnoses   Final diagnoses:  Injury of right knee, initial encounter    New Prescriptions Discharge Medication List as of 02/17/2017  2:34 PM    START taking these medications   Details  ibuprofen (ADVIL,MOTRIN) 800 MG tablet Take 1 tablet (800 mg total) by mouth 3 (three) times daily., Starting Fri 02/17/2017, Print         Lorayne Bender, PA-C 02/19/17 [id number]    Dorie Rank, MD 02/19/17  [phone]

## 2018-04-05 IMAGING — CT CT ANGIO CHEST
2 of 6 series · 18 of 46 positions shown · IV contrast (isovue)
Comparison: Radiographs 06/11/2016

CLINICAL DATA: Bilateral chest pain and dyspnea, onset today.

EXAM:
CT ANGIOGRAPHY CHEST WITH CONTRAST
TECHNIQUE: Multidetector CT imaging of the chest was performed using the
standard protocol during bolus administration of intravenous
contrast. Multiplanar CT image reconstructions and MIPs were
obtained to evaluate the vascular anatomy.
CONTRAST:  100 mL Isovue 370 intravenous

[Series 6: thins for pacs · axial · 0.64mm/px · z∈[-242,-36]mm · 15 of 226 slices shown]
[im 10/226  lung]
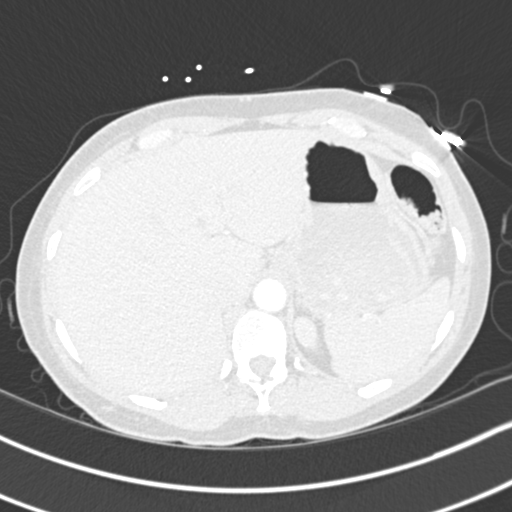
[im 30/226  soft-tissue]
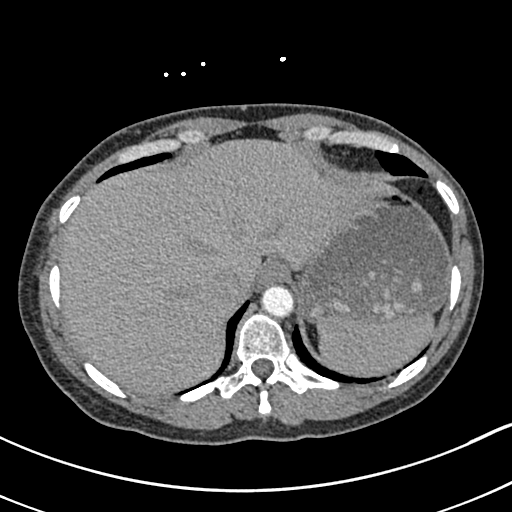
[im 40/226  lung]
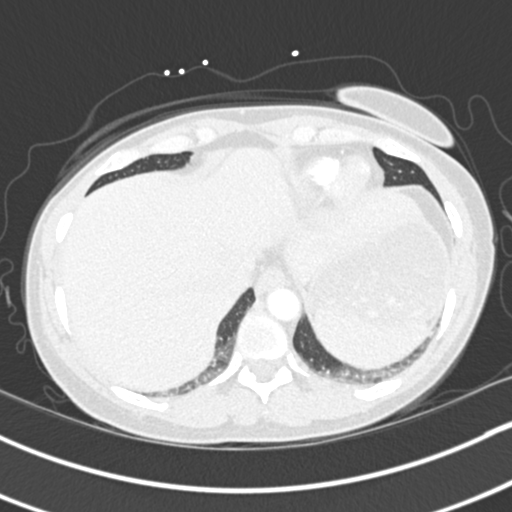
[im 59/226  soft-tissue]
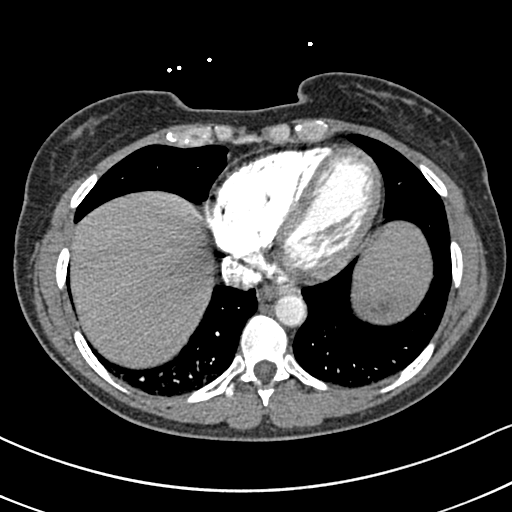
[im 69/226  lung]
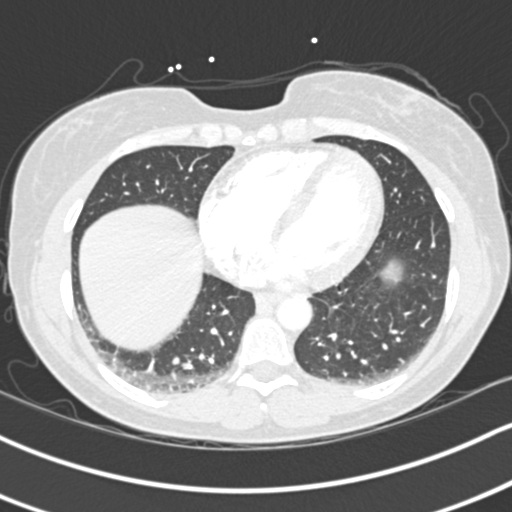
[im 89/226  soft-tissue]
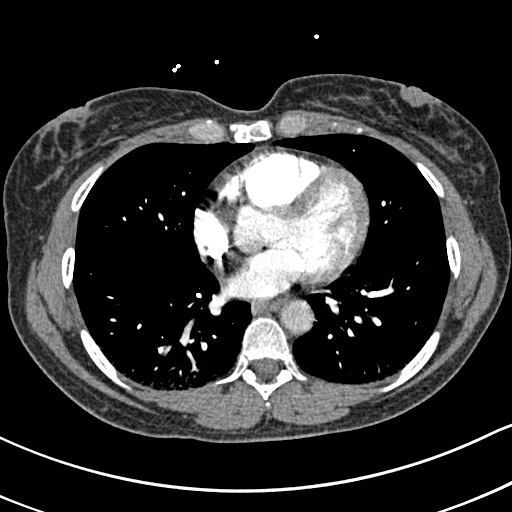
[im 98/226  lung]
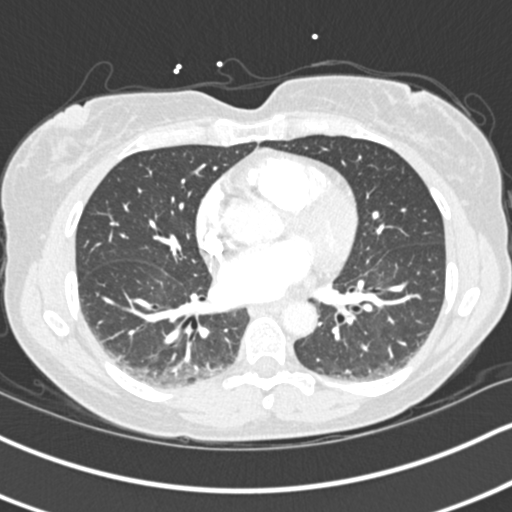
[im 118/226  soft-tissue]
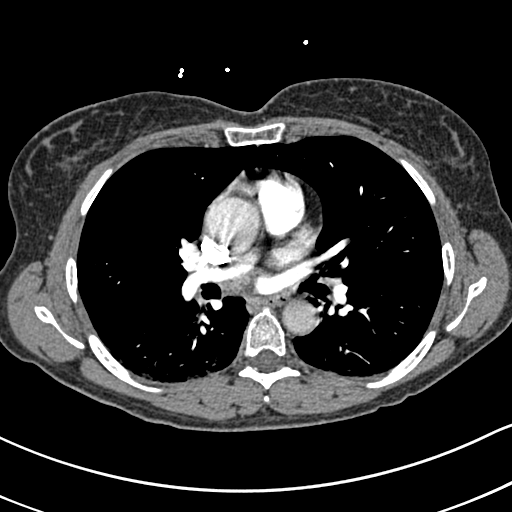
[im 128/226  lung]
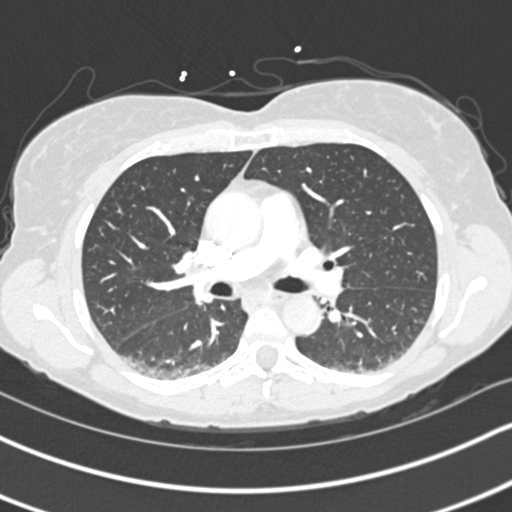
[im 137/226  soft-tissue]
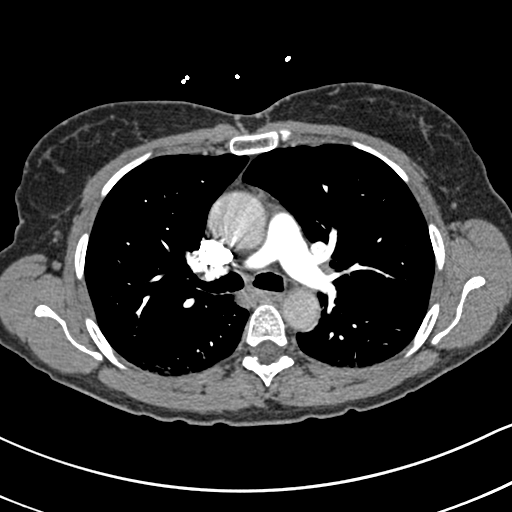
[im 157/226  lung]
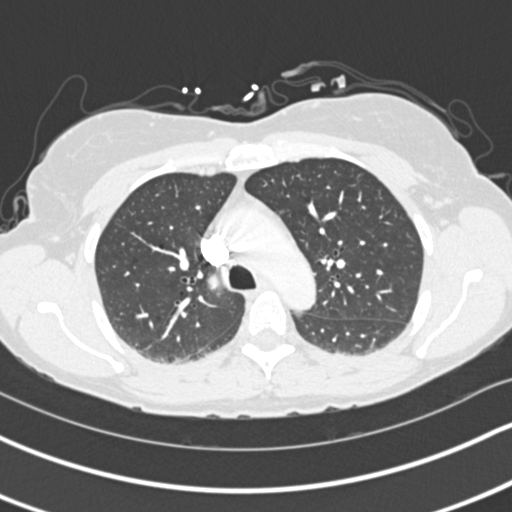
[im 167/226  soft-tissue]
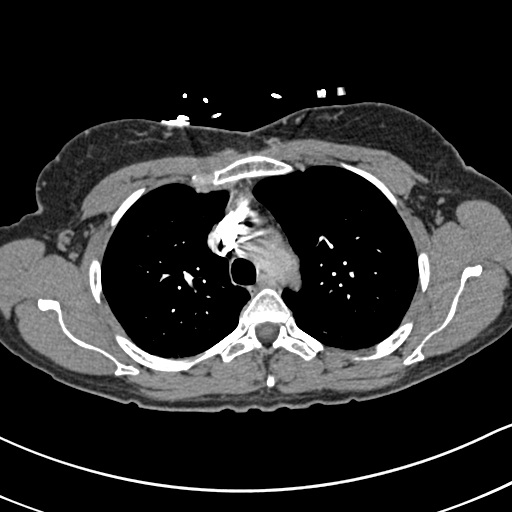
[im 186/226  lung]
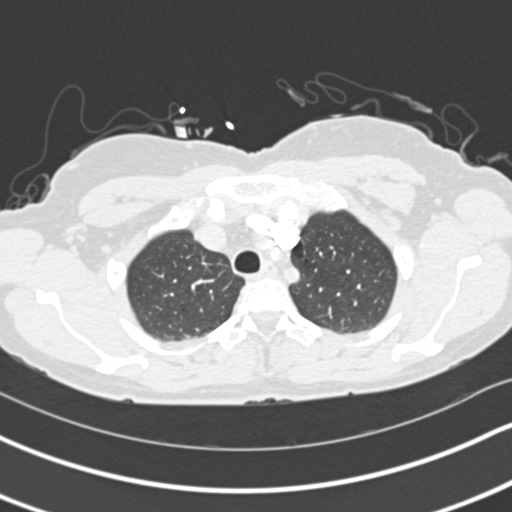
[im 196/226  soft-tissue]
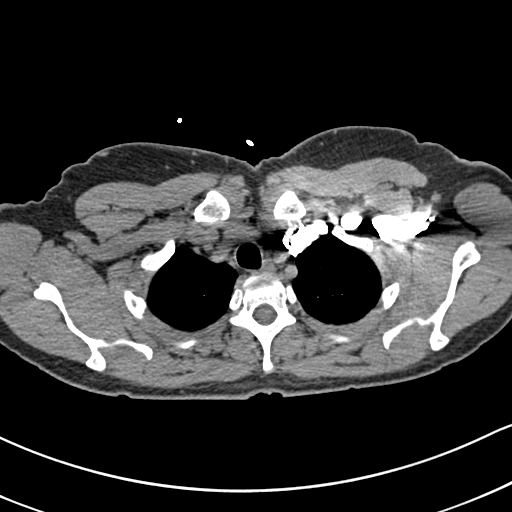
[im 216/226  lung]
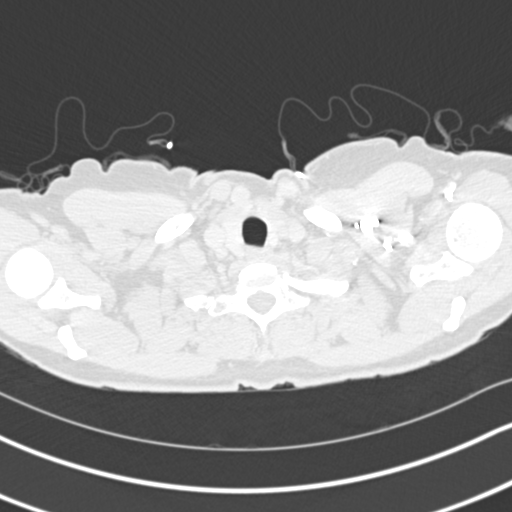

[Series 8: coronal mpr · coronal · 0.46mm/px · 3 of 103 slices shown]
[im 26/103  soft-tissue]
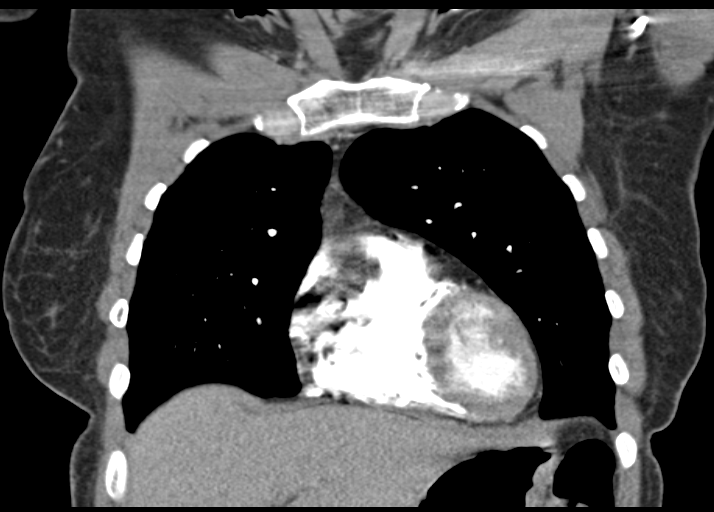
[im 52/103  soft-tissue]
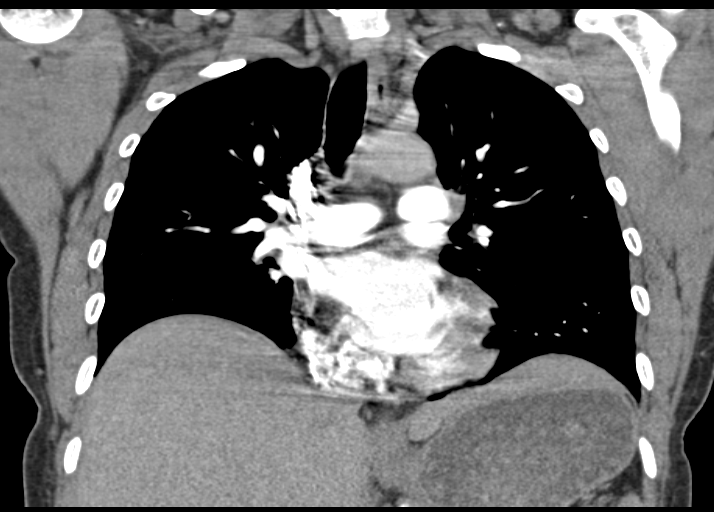
[im 77/103  soft-tissue]
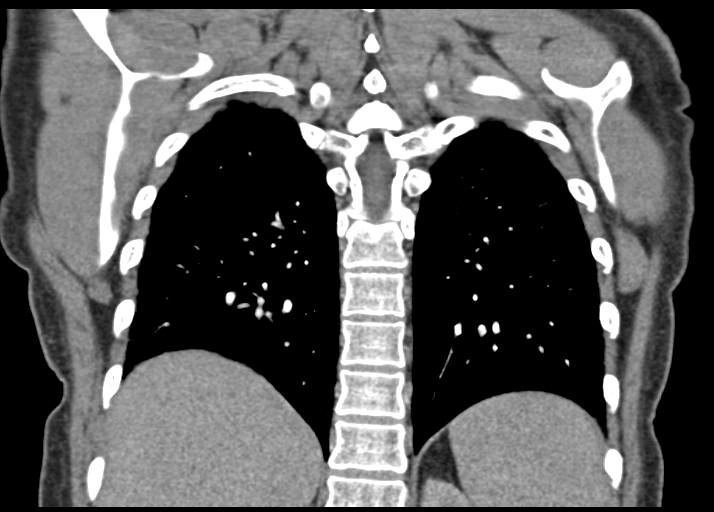

[18 of 46 positions shown; findings below may reference images not displayed]

FINDINGS: Cardiovascular: There is good opacification of the pulmonary
arteries. There is no pulmonary embolism. The thoracic aorta is
normal in caliber and intact.

Lungs: Clear

Central airways: Patent

Effusions: None

Lymphadenopathy: None

Esophagus: Unremarkable

Upper abdomen: No significant abnormality

Musculoskeletal: No significant abnormality

Review of the MIP images confirms the above findings.
IMPRESSION: Negative for acute pulmonary embolism.  No significant abnormality.
